# Patient Record
Sex: Female | Born: 1975 | Race: Black or African American | Hispanic: No | Marital: Married | Smoking: Never smoker
Health system: Southern US, Community
[De-identification: ages and names within clinical notes are randomized; demographics above are authoritative.]

## PROBLEM LIST (undated history)

## (undated) DIAGNOSIS — D649 Anemia, unspecified: Secondary | ICD-10-CM

## (undated) DIAGNOSIS — N939 Abnormal uterine and vaginal bleeding, unspecified: Secondary | ICD-10-CM

## (undated) HISTORY — PX: TONSILLECTOMY: SUR1361

---

## 2005-02-12 ENCOUNTER — Other Ambulatory Visit: Payer: Self-pay

## 2005-02-12 ENCOUNTER — Emergency Department: Payer: Self-pay | Admitting: Emergency Medicine

## 2006-06-16 ENCOUNTER — Emergency Department: Payer: Self-pay | Admitting: Emergency Medicine

## 2010-07-02 ENCOUNTER — Emergency Department: Payer: Self-pay | Admitting: Emergency Medicine

## 2011-01-31 ENCOUNTER — Emergency Department: Payer: Self-pay | Admitting: Emergency Medicine

## 2019-10-22 ENCOUNTER — Ambulatory Visit: Admit: 2019-10-22 | Payer: Self-pay | Admitting: Surgery

## 2019-10-22 SURGERY — LAPAROSCOPIC CHOLECYSTECTOMY
Anesthesia: Choice

## 2019-12-06 ENCOUNTER — Other Ambulatory Visit: Payer: Self-pay | Admitting: Family Medicine

## 2019-12-06 DIAGNOSIS — Z20822 Contact with and (suspected) exposure to covid-19: Secondary | ICD-10-CM

## 2019-12-07 ENCOUNTER — Ambulatory Visit: Payer: BC Managed Care – PPO | Attending: Internal Medicine

## 2019-12-07 DIAGNOSIS — Z20822 Contact with and (suspected) exposure to covid-19: Secondary | ICD-10-CM

## 2019-12-08 LAB — NOVEL CORONAVIRUS, NAA: SARS-CoV-2, NAA: NOT DETECTED

## 2020-06-28 DIAGNOSIS — Z1231 Encounter for screening mammogram for malignant neoplasm of breast: Secondary | ICD-10-CM | POA: Diagnosis not present

## 2021-03-28 ENCOUNTER — Other Ambulatory Visit: Payer: Self-pay | Admitting: Family Medicine

## 2021-03-29 LAB — CMP12+LP+TP+TSH+6AC+CBC/D/PLT
ALT: 18 IU/L (ref 0–32)
AST: 23 IU/L (ref 0–40)
Albumin/Globulin Ratio: 1.7 (ref 1.2–2.2)
Albumin: 4.3 g/dL (ref 3.8–4.8)
Alkaline Phosphatase: 66 IU/L (ref 44–121)
BUN/Creatinine Ratio: 13 (ref 9–23)
BUN: 10 mg/dL (ref 6–24)
Basophils Absolute: 0 10*3/uL (ref 0.0–0.2)
Basos: 1 %
Bilirubin Total: 0.5 mg/dL (ref 0.0–1.2)
Calcium: 8.7 mg/dL (ref 8.7–10.2)
Chloride: 103 mmol/L (ref 96–106)
Chol/HDL Ratio: 3 ratio (ref 0.0–4.4)
Cholesterol, Total: 170 mg/dL (ref 100–199)
Creatinine, Ser: 0.77 mg/dL (ref 0.57–1.00)
EOS (ABSOLUTE): 0.2 10*3/uL (ref 0.0–0.4)
Eos: 3 %
Estimated CHD Risk: <0.5 times avg. (ref 0.0–1.0)
Free Thyroxine Index: 2.3 (ref 1.2–4.9)
GGT: 31 IU/L (ref 0–60)
Globulin, Total: 2.5 g/dL (ref 1.5–4.5)
Glucose: 95 mg/dL (ref 65–99)
HDL: 57 mg/dL (ref >39)
Hematocrit: 29.5 % — ABNORMAL LOW (ref 34.0–46.6)
Hemoglobin: 8.4 g/dL — ABNORMAL LOW (ref 11.1–15.9)
Immature Grans (Abs): 0 10*3/uL (ref 0.0–0.1)
Immature Granulocytes: 0 %
Iron: 16 ug/dL — ABNORMAL LOW (ref 27–159)
LDH: 168 IU/L (ref 119–226)
LDL Chol Calc (NIH): 101 mg/dL — ABNORMAL HIGH (ref 0–99)
Lymphocytes Absolute: 1.9 10*3/uL (ref 0.7–3.1)
Lymphs: 35 %
MCH: 19.3 pg — ABNORMAL LOW (ref 26.6–33.0)
MCHC: 28.5 g/dL — ABNORMAL LOW (ref 31.5–35.7)
MCV: 68 fL — ABNORMAL LOW (ref 79–97)
Monocytes Absolute: 0.3 10*3/uL (ref 0.1–0.9)
Monocytes: 6 %
Neutrophils Absolute: 3 10*3/uL (ref 1.4–7.0)
Neutrophils: 55 %
Phosphorus: 2.9 mg/dL — ABNORMAL LOW (ref 3.0–4.3)
Platelets: 304 10*3/uL (ref 150–450)
Potassium: 4.2 mmol/L (ref 3.5–5.2)
RBC: 4.36 x10E6/uL (ref 3.77–5.28)
RDW: 16.3 % — ABNORMAL HIGH (ref 11.7–15.4)
Sodium: 138 mmol/L (ref 134–144)
T3 Uptake Ratio: 25 % (ref 24–39)
T4, Total: 9 ug/dL (ref 4.5–12.0)
TSH: 1.12 u[IU]/mL (ref 0.450–4.500)
Total Protein: 6.8 g/dL (ref 6.0–8.5)
Triglycerides: 64 mg/dL (ref 0–149)
Uric Acid: 4.3 mg/dL (ref 2.6–6.2)
VLDL Cholesterol Cal: 12 mg/dL (ref 5–40)
WBC: 5.5 10*3/uL (ref 3.4–10.8)
eGFR: 96 mL/min/{1.73_m2} (ref >59)

## 2021-04-16 ENCOUNTER — Ambulatory Visit (INDEPENDENT_AMBULATORY_CARE_PROVIDER_SITE_OTHER): Payer: BC Managed Care – PPO | Admitting: Obstetrics and Gynecology

## 2021-04-16 ENCOUNTER — Encounter: Payer: Self-pay | Admitting: Obstetrics and Gynecology

## 2021-04-16 ENCOUNTER — Other Ambulatory Visit: Payer: Self-pay

## 2021-04-16 ENCOUNTER — Other Ambulatory Visit (HOSPITAL_COMMUNITY)
Admission: RE | Admit: 2021-04-16 | Discharge: 2021-04-16 | Disposition: A | Discharge status: Home / Self Care | Payer: BC Managed Care – PPO | Source: Ambulatory Visit | Attending: Obstetrics and Gynecology | Admitting: Obstetrics and Gynecology

## 2021-04-16 VITALS — BP 140/86 | Ht 61.0 in | Wt 192.0 lb

## 2021-04-16 DIAGNOSIS — D500 Iron deficiency anemia secondary to blood loss (chronic): Secondary | ICD-10-CM | POA: Diagnosis not present

## 2021-04-16 DIAGNOSIS — Z124 Encounter for screening for malignant neoplasm of cervix: Secondary | ICD-10-CM | POA: Diagnosis not present

## 2021-04-16 DIAGNOSIS — N939 Abnormal uterine and vaginal bleeding, unspecified: Secondary | ICD-10-CM | POA: Diagnosis not present

## 2021-04-16 NOTE — Patient Instructions (Signed)
Menorrhagia Menorrhagia is a form of abnormal uterine bleeding in which menstrual periods are heavy or last longer than normal. With menorrhagia, the periods may cause enough blood loss and cramping that a woman becomes unable to take part in her usual activities. What are the causes? Common causes of this condition include:  Polyps or fibroids. These are noncancerous growths in the uterus.  An imbalance of the hormones estrogen and progesterone.  Anovulation, which occurs when one of the ovaries does not release an egg during one or more months.  A problem with the thyroid gland (hypothyroidism).  Side effects of having an intrauterine device (IUD).  Side effects of some medicines, such as NSAIDs or blood thinners.  A bleeding disorder that stops the blood from clotting normally. In some cases, the cause of this condition is not known. What increases the risk? You are more likely to develop this condition if you have cancer of the uterus. What are the signs or symptoms? Symptoms of this condition include:  Routinely having to change your pad or tampon every 1-2 hours because it is soaked.  Needing to use pads and tampons at the same time because of heavy bleeding.  Needing to wake up to change your pads or tampons during the night.  Passing blood clots larger than 1 inch (2.5 cm) in size.  Having bleeding that lasts for more than 7 days.  Having symptoms of low iron levels (anemia), such as tiredness (fatigue) or shortness of breath. How is this diagnosed? This condition may be diagnosed based on:  A physical exam.  Your symptoms and menstrual history.  Tests, such as: ? Blood tests to check if you are pregnant or if you have hormonal changes, a bleeding or thyroid disorder, anemia, or other problems. ? Pap test to check for cancerous changes, infections, or inflammation. ? Endometrial biopsy. This test involves removing a tissue sample from the lining of the uterus  (endometrium) to be examined under a microscope. ? Pelvic ultrasound. This test uses sound waves to create images of your uterus, ovaries, and vagina. The images can show if you have fibroids or other growths. ? Hysteroscopy. For this test, a thin, flexible tube with a light on the end (hysteroscope) is used to look inside your uterus. How is this treated? Treatment may not be needed for this condition. If it is needed, the best treatment for you will depend on:  Whether you need to prevent pregnancy.  Your desire to have children in the future.  The cause and severity of your bleeding.  Your personal preference. Medicine Medicines are the first step in treatment. You may be treated with:  Hormonal birth control methods. These treatments reduce bleeding during your menstrual period. They include: ? Birth control pills. ? Skin patch. ? Vaginal ring. ? Shots (injections) that you get every 3 months. ? Hormonal IUD. ? Implants that go under the skin.  Medicines that thicken the blood and slow bleeding.  Medicines that reduce swelling, such as ibuprofen.  Medicines that contain an artificial (synthetic) hormone called progestin.  Medicines that make the ovaries stop working for a short time.  Iron supplements to treat anemia.   Surgery If medicines do not work, surgery may be done. Surgical options may include:  Dilation and curettage (D&C). In this procedure, your health care provider opens the lowest part of the uterus (cervix) and then scrapes or suctions tissue from the endometrium. This reduces menstrual bleeding.  Operative hysteroscopy. In this procedure,   a hysteroscope is used to view your uterus and help remove polyps that may be causing heavy periods.  Endometrial ablation. This is when various techniques are used to permanently destroy your entire endometrium. After endometrial ablation, most women have little or no menstrual flow. This procedure reduces your ability to  become pregnant.  Endometrial resection. In this procedure, an electrosurgical wire loop is used to remove the endometrium. This procedure reduces your ability to become pregnant.  Hysterectomy. This is surgical removal of your uterus. This is a permanent procedure that stops menstrual periods. Pregnancy is not possible after a hysterectomy. Follow these instructions at home: Medicines  Take over-the-counter and prescription medicines only as told by your health care provider. This includes iron pills.  Do not change or switch medicines without asking your health care provider.  Do not take aspirin or medicines that contain aspirin 1 week before or during your menstrual period. Aspirin may make bleeding worse. Managing constipation Your iron pills may cause constipation. If you are taking prescription iron supplements, you may need to take these actions to prevent or treat constipation:  Drink enough fluid to keep your urine pale yellow.  Take over-the-counter or prescription medicines.  Eat foods that are high in fiber, such as beans, whole grains, and fresh fruits and vegetables.  Limit foods that are high in fat and processed sugars, such as fried or sweet foods. General instructions  If you need to change your sanitary pad or tampon more than once every 2 hours, limit your activity until the bleeding stops.  Eat well-balanced meals, including foods that are high in iron. Foods that have a lot of iron include leafy green vegetables, meat, liver, eggs, and whole-grain breads and cereals.  Do not try to lose weight until the abnormal bleeding has stopped and your blood iron level is back to normal. If you need to lose weight, work with your health care provider to lose weight safely.  Keep all follow-up visits. This is important. Contact a health care provider if:  You soak through a pad or tampon every 1 or 2 hours, and this happens every time you have a period.  You need to use  pads and tampons at the same time because you are bleeding so much.  You have nausea, vomiting, diarrhea, or other problems related to medicines you are taking. Get help right away if:  You soak through more than a pad or tampon in 1 hour.  You pass clots bigger than 1 inch (2.5 cm) wide.  You feel short of breath.  You feel like your heart is beating too fast.  You feel dizzy or you faint.  You feel very weak or tired. Summary  Menorrhagia is a form of abnormal uterine bleeding in which menstrual periods are heavy or last longer than normal.  Treatment may not be needed for this condition. If it is needed, it may include medicines or procedures.  Take over-the-counter and prescription medicines only as told by your health care provider. This includes iron pills.  Get help right away if you have heavy bleeding that soaks through more than a pad or tampon in 1 hour, you pass large clots, or you feel dizzy, short of breath, or very weak or tired. This information is not intended to replace advice given to you by your health care provider. Make sure you discuss any questions you have with your health care provider. Document Revised: 08/08/2020 Document Reviewed: 08/08/2020 Elsevier Patient Education  2021   Elsevier Inc[employer]  

## 2021-04-16 NOTE — Progress Notes (Signed)
Gynecology Abnormal Uterine Bleeding Initial Evaluation   Chief Complaint:  Chief Complaint  Patient presents with  . Gynecologic Exam    Self referral - heavy periods. RM 4    History of Present Illness:    Paitient is a 44 y.o. G1P0 who LMP was Patient's last menstrual period was 04/05/2021., presents today for a problem visit.  She complains of heavy menstrual bleeding.  The patient menstrual complaints are chronic present for the past 6 monthsNo currently on any hormonal contraceptive for cycle control.  No recent pap results  Previous evaluation: none Prior Diagnosis: none Previous Treatment: none  Prolactin: No HA, vision changes, nippled discharge Thyroid/PCOS: no change weight, no hirsutism, acne, constipation/diarrhea   Paramter Normal / Abnormal Prsent  Frequency Amenoorhea     Infrequent (>38 days)     Normal (?24 days ?38 days) X   Freequent (<24 days)    Duration Normal (?8 days) X   Prolonged (>8 days)    Regularity Regular (shortest to longest cycle variation ?7-9 days)* X   Irregular (shortest to longest cycle variation ?8-10days)*    Flow Volume Light    (Self reported) Normal     Heavy X      Intermenstrual Bleeding None X   Random     Cyclical early     Cyclical mid     Cyclical late        Unscheduled Bleeding  Not applicable X  (exogenous hormones) Absent     Present     FIGO AUB I System: *The available evidence suggests that, using these criteria, the normal range (shortest to longest) varies with age: 53-25 y of age, ?57 d; 3-41 y, ?7 d; and for 58-45 y, ?9 d    Review of Systems: Review of Systems  Constitutional: Negative.   Gastrointestinal: Negative.   Genitourinary: Negative.   Neurological: Negative for headaches.    Past Medical History:  There are no problems to display for this patient.   Obstetric History: G1P0  Family History:  Family History  Problem Relation Age of Onset  . Hypertension Mother   . Hypertension  Father     Social History:  Social History   Socioeconomic History  . Marital status: Married    Spouse name: Not on file  . Number of children: Not on file  . Years of education: Not on file  . Highest education level: Not on file  Occupational History  . Occupation: Midwife  Tobacco Use  . Smoking status: Never Smoker  . Smokeless tobacco: Never Used  Vaping Use  . Vaping Use: Never used  Substance and Sexual Activity  . Alcohol use: Not Currently  . Drug use: Never  . Sexual activity: Yes    Partners: Male    Birth control/protection: None  Other Topics Concern  . Not on file  Social History Narrative  . Not on file   Social Determinants of Health   Financial Resource Strain: Not on file  Food Insecurity: Not on file  Transportation Needs: Not on file  Physical Activity: Not on file  Stress: Not on file  Social Connections: Not on file  Intimate Partner Violence: Not on file    Allergies:  Allergies  Allergen Reactions  . Guaifenesin Hives    Medications: Prior to Admission medications   Not on File    Physical Exam Blood pressure 140/86, height 5\' 1"  (1.549 m), weight 192 lb (87.1 kg), last menstrual period 04/05/2021.  Patient's last menstrual period was 04/05/2021.  General: NAD HEENT: normocephalic, anicteric Thyroid: no enlargement, no palpable nodules Pulmonary: No increased work of breathing Cardiovascular: RRR, distal pulses 2+ Abdomen: Soft, non-tender, non-distended.  Umbilicus without lesions.  No hepatomegaly, splenomegaly or masses palpable. No evidence of hernia  Genitourinary:  External: Normal external female genitalia.  Normal urethral meatus, normal Bartholin's and Skene's glands.    Vagina: Normal vaginal mucosa, no evidence of prolapse.    Cervix: Grossly normal in appearance, no bleeding  Uterus: Non-enlarged, mobile, normal contour.  No CMT  Adnexa: ovaries non-enlarged, no adnexal masses  Rectal: deferred  Lymphatic: no  evidence of inguinal lymphadenopathy Extremities: no edema, erythema, or tenderness Neurologic: Grossly intact Psychiatric: mood appropriate, affect full  Female chaperone present for pelvic portions of the physical exam  Assessment: 45 y.o. G1P0 with abnormal uterine bleeding  Plan: Problem List Items Addressed This Visit   None   Visit Diagnoses    Abnormal uterine bleeding    -  Primary   Relevant Orders   FSH (Completed)   Estradiol (Completed)   Prolactin (Completed)   US PELVIS TRANSVAGINAL NON-OB (TV ONLY)   Screening for malignant neoplasm of cervix       Relevant Orders   Cytology - PAP (Completed)   Iron deficiency anemia due to chronic blood loss       Relevant Orders   FSH (Completed)   Estradiol (Completed)   Prolactin (Completed)      1) Discussed management options for abnormal uterine bleeding including expectant, NSAIDs, tranexamic acid (Lysteda), oral progesterone (Provera, norethindrone, megace), Depo Provera, Levonorgestrel containing IUD, endometrial ablation (Novasure) or hysterectomy as definitive surgical management.  Discussed risks and benefits of each method.   Final management decision will hinge on results of patient's work up and whether an underlying etiology for the patients bleeding symptoms can be discerned.  We will conduct a basic work up examining using the PALM-COIEN classification system.   The role of unopposed estrogen in the development of endometrial hyperplasia or carcinoma is discussed.  The risk of endometrial hyperplasia is linearly correlated with increasing BMI given the production of estrone by adipose tissue. Printed patient education handouts were given to the patient to review at home.  Bleeding precautions reviewed.  - management options pending results of work up - pap brought up to date  2) Return follow up following results from [facility].   Vena Austria, MD, [facility], [facility] 04/16/2021, 4:02 PM

## 2021-04-17 LAB — ESTRADIOL: Estradiol: 17.4 pg/mL

## 2021-04-17 LAB — PROLACTIN: Prolactin: 22.9 ng/mL (ref 4.8–23.3)

## 2021-04-17 LAB — FOLLICLE STIMULATING HORMONE: FSH: 25.9 m[IU]/mL

## 2021-04-19 LAB — CYTOLOGY - PAP
Comment: NEGATIVE
Diagnosis: NEGATIVE
High risk HPV: NEGATIVE

## 2021-05-25 ENCOUNTER — Ambulatory Visit
Admission: RE | Admit: 2021-05-25 | Discharge: 2021-05-25 | Disposition: A | Discharge status: Home / Self Care | Payer: BC Managed Care – PPO | Source: Ambulatory Visit | Attending: Obstetrics and Gynecology | Admitting: Obstetrics and Gynecology

## 2021-05-25 ENCOUNTER — Other Ambulatory Visit: Payer: Self-pay

## 2021-05-25 DIAGNOSIS — N939 Abnormal uterine and vaginal bleeding, unspecified: Secondary | ICD-10-CM | POA: Diagnosis not present

## 2021-05-28 ENCOUNTER — Ambulatory Visit: Payer: BC Managed Care – PPO | Admitting: Obstetrics and Gynecology

## 2021-05-28 ENCOUNTER — Ambulatory Visit (INDEPENDENT_AMBULATORY_CARE_PROVIDER_SITE_OTHER): Payer: BC Managed Care – PPO

## 2021-05-28 ENCOUNTER — Ambulatory Visit
Admission: EM | Admit: 2021-05-28 | Discharge: 2021-05-28 | Disposition: A | Discharge status: Home / Self Care | Payer: BC Managed Care – PPO | Attending: Internal Medicine | Admitting: Internal Medicine

## 2021-05-28 ENCOUNTER — Encounter: Payer: Self-pay | Admitting: Emergency Medicine

## 2021-05-28 ENCOUNTER — Other Ambulatory Visit: Payer: Self-pay

## 2021-05-28 DIAGNOSIS — M25561 Pain in right knee: Secondary | ICD-10-CM | POA: Diagnosis not present

## 2021-05-28 DIAGNOSIS — W19XXXA Unspecified fall, initial encounter: Secondary | ICD-10-CM

## 2021-05-28 DIAGNOSIS — M79644 Pain in right finger(s): Secondary | ICD-10-CM

## 2021-05-28 DIAGNOSIS — S63601A Unspecified sprain of right thumb, initial encounter: Secondary | ICD-10-CM | POA: Diagnosis not present

## 2021-05-28 MED ORDER — IBUPROFEN 800 MG PO TABS: 800.0000 mg | ORAL_TABLET | Freq: Three times a day (TID) | ORAL | 0 refills | Status: DC

## 2021-05-28 NOTE — Discharge Instructions (Addendum)
Follow up with [facility] in 2 days.  Ice areas of pain for 20 minutes with cloth over the areas 3-4 times a day for the next 2 days
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
## 2021-06-08 DIAGNOSIS — Z87442 Personal history of urinary calculi: Secondary | ICD-10-CM

## 2021-06-08 HISTORY — DX: Personal history of urinary calculi: Z87.442

## 2021-06-14 ENCOUNTER — Emergency Department: Payer: BC Managed Care – PPO

## 2021-06-14 ENCOUNTER — Emergency Department
Admission: EM | Admit: 2021-06-14 | Discharge: 2021-06-14 | Disposition: A | Discharge status: Home / Self Care | Payer: BC Managed Care – PPO | Attending: Student in an Organized Health Care Education/Training Program | Admitting: Student in an Organized Health Care Education/Training Program

## 2021-06-14 ENCOUNTER — Other Ambulatory Visit: Payer: Self-pay

## 2021-06-14 ENCOUNTER — Encounter: Payer: Self-pay | Admitting: Emergency Medicine

## 2021-06-14 DIAGNOSIS — B9689 Other specified bacterial agents as the cause of diseases classified elsewhere: Secondary | ICD-10-CM | POA: Insufficient documentation

## 2021-06-14 DIAGNOSIS — R109 Unspecified abdominal pain: Secondary | ICD-10-CM | POA: Diagnosis not present

## 2021-06-14 DIAGNOSIS — Z20822 Contact with and (suspected) exposure to covid-19: Secondary | ICD-10-CM | POA: Diagnosis not present

## 2021-06-14 DIAGNOSIS — N12 Tubulo-interstitial nephritis, not specified as acute or chronic: Secondary | ICD-10-CM | POA: Insufficient documentation

## 2021-06-14 DIAGNOSIS — R000 Tachycardia, unspecified: Secondary | ICD-10-CM | POA: Diagnosis not present

## 2021-06-14 DIAGNOSIS — R350 Frequency of micturition: Secondary | ICD-10-CM | POA: Diagnosis not present

## 2021-06-14 DIAGNOSIS — N200 Calculus of kidney: Secondary | ICD-10-CM | POA: Diagnosis not present

## 2021-06-14 LAB — BASIC METABOLIC PANEL
Anion gap: 9 (ref 5–15)
BUN: 12 mg/dL (ref 6–20)
CO2: 25 mmol/L (ref 22–32)
Calcium: 8.7 mg/dL — ABNORMAL LOW (ref 8.9–10.3)
Chloride: 106 mmol/L (ref 98–111)
Creatinine, Ser: 0.65 mg/dL (ref 0.44–1.00)
GFR, Estimated: >60 mL/min (ref >60)
Glucose, Bld: 117 mg/dL — ABNORMAL HIGH (ref 70–99)
Potassium: 3.8 mmol/L (ref 3.5–5.1)
Sodium: 140 mmol/L (ref 135–145)

## 2021-06-14 LAB — CBC
HCT: 26.3 % — ABNORMAL LOW (ref 36.0–46.0)
Hemoglobin: 7.5 g/dL — ABNORMAL LOW (ref 12.0–15.0)
MCH: 19.7 pg — ABNORMAL LOW (ref 26.0–34.0)
MCHC: 28.5 g/dL — ABNORMAL LOW (ref 30.0–36.0)
MCV: 69 fL — ABNORMAL LOW (ref 80.0–100.0)
Platelets: 300 10*3/uL (ref 150–400)
RBC: 3.81 MIL/uL — ABNORMAL LOW (ref 3.87–5.11)
RDW: 18.4 % — ABNORMAL HIGH (ref 11.5–15.5)
WBC: 9.9 10*3/uL (ref 4.0–10.5)
nRBC: 0 % (ref 0.0–0.2)

## 2021-06-14 LAB — URINALYSIS, COMPLETE (UACMP) WITH MICROSCOPIC
Bilirubin Urine: NEGATIVE
Glucose, UA: NEGATIVE mg/dL
Ketones, ur: NEGATIVE mg/dL
Nitrite: NEGATIVE
Protein, ur: NEGATIVE mg/dL
RBC / HPF: >50 RBC/hpf — ABNORMAL HIGH (ref 0–5)
Specific Gravity, Urine: 1.01 (ref 1.005–1.030)
pH: 6 (ref 5.0–8.0)

## 2021-06-14 LAB — POC URINE PREG, ED: Preg Test, Ur: NEGATIVE

## 2021-06-14 LAB — RESP PANEL BY RT-PCR (FLU A&B, COVID) ARPGX2
Influenza A by PCR: NEGATIVE
Influenza B by PCR: NEGATIVE
SARS Coronavirus 2 by RT PCR: NEGATIVE

## 2021-06-14 LAB — CK: Total CK: 70 U/L (ref 38–234)

## 2021-06-14 LAB — LACTIC ACID, PLASMA: Lactic Acid, Venous: 0.7 mmol/L (ref 0.5–1.9)

## 2021-06-14 MED ORDER — OXYCODONE HCL 5 MG PO TABS
5.0000 mg | ORAL_TABLET | Freq: Once | ORAL | Status: AC
  Administered 2021-06-14: 5 mg via ORAL
  Filled 2021-06-14: qty 1

## 2021-06-14 MED ORDER — ONDANSETRON 4 MG PO TBDP: 4.0000 mg | ORAL_TABLET | Freq: Three times a day (TID) | ORAL | 0 refills | Status: DC | PRN

## 2021-06-14 MED ORDER — CEPHALEXIN 500 MG PO CAPS: 500.0000 mg | ORAL_CAPSULE | Freq: Three times a day (TID) | ORAL | 0 refills | Status: AC

## 2021-06-14 MED ORDER — ACETAMINOPHEN 500 MG PO TABS
1000.0000 mg | ORAL_TABLET | Freq: Once | ORAL | Status: AC
  Administered 2021-06-14: 1000 mg via ORAL
  Filled 2021-06-14: qty 2

## 2021-06-14 MED ORDER — SODIUM CHLORIDE 0.9 % IV SOLN
1.0000 g | Freq: Once | INTRAVENOUS | Status: AC
  Administered 2021-06-14: 1 g via INTRAVENOUS
  Filled 2021-06-14: qty 10

## 2021-06-14 MED ORDER — HYDROCODONE-ACETAMINOPHEN 5-325 MG PO TABS: 1.0000 | ORAL_TABLET | ORAL | 0 refills | Status: DC | PRN

## 2021-06-14 MED ORDER — SODIUM CHLORIDE 0.9 % IV BOLUS
1000.0000 mL | Freq: Once | INTRAVENOUS | Status: AC
  Administered 2021-06-14: 1000 mL via INTRAVENOUS

## 2021-06-14 NOTE — ED Triage Notes (Signed)
Pt comes into the ED via POV c/o weakness and possible dehydration.  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  Pt currently has even and unlabored respirations and is in NAD.

## 2021-06-14 NOTE — ED Provider Notes (Signed)
[facility] Emergency Department Provider Note    Event Date/Time   First MD Initiated Contact with Patient 06/14/21 1338     (approximate)  I have reviewed the triage vital signs and the nursing notes.   HISTORY  Chief Complaint Dehydration and Weakness    HPI Kara Carter is a 45 y.o. female presents to the ER for feeling dehydrated and overheated.  States she works in a [employer] where they do not have [employer].  Feels like her urine output has decreased but does also feel like she is having back pain and having increased urinary frequency.  Came to the ER today because she felt severely fatigued with worsening symptoms while at work today.  History reviewed. No pertinent past medical history. Family History  Problem Relation Age of Onset  . Hypertension Mother   . Hypertension Father    History reviewed. No pertinent surgical history. There are no problems to display for this patient.     Prior to Admission medications   Medication Sig Start Date End Date Taking? Authorizing Provider  cephALEXin (KEFLEX) 500 MG capsule Take 1 capsule (500 mg total) by mouth 3 (three) times daily for 7 days. 06/14/21 06/21/21 Yes Willy Eddy, MD  ondansetron (ZOFRAN ODT) 4 MG disintegrating tablet Take 1 tablet (4 mg total) by mouth every 8 (eight) hours as needed for nausea or vomiting. 06/14/21  Yes Willy Eddy, MD  ibuprofen (ADVIL) 800 MG tablet Take 1 tablet (800 mg total) by mouth 3 (three) times daily. 05/28/21   Rodriguez-Southworth, Nettie Elm, PA-C    Allergies Guaifenesin    Social History Social History   Tobacco Use  . Smoking status: Never  . Smokeless tobacco: Never  Vaping Use  . Vaping Use: Never used  Substance Use Topics  . Alcohol use: Not Currently  . Drug use: Never    Review of Systems Patient denies headaches, rhinorrhea, blurry vision, numbness, shortness of breath, chest pain, edema, cough, abdominal pain, nausea, vomiting,  diarrhea, dysuria, fevers, rashes or hallucinations unless otherwise stated above in HPI. ____________________________________________   PHYSICAL EXAM:  VITAL SIGNS: Vitals:   06/14/21 1340 06/14/21 1512  BP: (!) 111/94 (!) 150/87  Pulse: 95 91  Resp: 18 16  Temp:    SpO2: 100% 100%    Constitutional: Alert and oriented.  Eyes: Conjunctivae are normal.  Head: Atraumatic. Nose: No congestion/rhinnorhea. Mouth/Throat: Mucous membranes are moist.   Neck: No stridor. Painless ROM.  Cardiovascular: Normal rate, regular rhythm. Grossly normal heart sounds.  Good peripheral circulation. Respiratory: Normal respiratory effort.  No retractions. Lungs CTAB. Gastrointestinal: Soft and nontender. No distention. No abdominal bruits. + BL CVA tenderness. Genitourinary:  Musculoskeletal: No lower extremity tenderness nor edema.  No joint effusions. Neurologic:  Normal speech and language. No gross focal neurologic deficits are appreciated. No facial droop Skin:  Skin is warm, dry and intact. No rash noted. Psychiatric: Mood and affect are normal. Speech and behavior are normal.  ____________________________________________   LABS (all labs ordered are listed, but only abnormal results are displayed)  Results for orders placed or performed during the hospital encounter of 06/14/21 (from the past 24 hour(s))  Basic metabolic panel     Status: Abnormal   Collection Time: 06/14/21 12:43 PM  Result Value Ref Range   Sodium 140 135 - 145 mmol/L   Potassium 3.8 3.5 - 5.1 mmol/L   Chloride 106 98 - 111 mmol/L   CO2 25 22 - 32 mmol/L  Glucose, Bld 117 (H) 70 - 99 mg/dL   BUN 12 6 - 20 mg/dL   Creatinine, Ser 1.94 0.44 - 1.00 mg/dL   Calcium 8.7 (L) 8.9 - 10.3 mg/dL   GFR, Estimated >17 >40 mL/min   Anion gap 9 5 - 15  CBC     Status: Abnormal   Collection Time: 06/14/21 12:43 PM  Result Value Ref Range   WBC 9.9 4.0 - 10.5 K/uL   RBC 3.81 (L) 3.87 - 5.11 MIL/uL   Hemoglobin 7.5 (L)  12.0 - 15.0 g/dL   HCT 81.4 (L) 48.1 - 85.6 %   MCV 69.0 (L) 80.0 - 100.0 fL   MCH 19.7 (L) 26.0 - 34.0 pg   MCHC 28.5 (L) 30.0 - 36.0 g/dL   RDW 31.4 (H) 97.0 - 26.3 %   Platelets 300 150 - 400 K/uL   nRBC 0.0 0.0 - 0.2 %  Urinalysis, Complete w Microscopic Urine, Clean Catch     Status: Abnormal   Collection Time: 06/14/21 12:45 PM  Result Value Ref Range   Color, Urine YELLOW (A) YELLOW   APPearance HAZY (A) CLEAR   Specific Gravity, Urine 1.010 1.005 - 1.030   pH 6.0 5.0 - 8.0   Glucose, UA NEGATIVE NEGATIVE mg/dL   Hgb urine dipstick LARGE (A) NEGATIVE   Bilirubin Urine NEGATIVE NEGATIVE   Ketones, ur NEGATIVE NEGATIVE mg/dL   Protein, ur NEGATIVE NEGATIVE mg/dL   Nitrite NEGATIVE NEGATIVE   Leukocytes,Ua SMALL (A) NEGATIVE   RBC / HPF >50 (H) 0 - 5 RBC/hpf   WBC, UA 21-50 0 - 5 WBC/hpf   Bacteria, UA MANY (A) NONE SEEN   Squamous Epithelial / LPF 6-10 0 - 5   Mucus PRESENT   POC urine preg, ED     Status: None   Collection Time: 06/14/21 12:50 PM  Result Value Ref Range   Preg Test, Ur NEGATIVE NEGATIVE  Resp Panel by RT-PCR (Flu A&B, Covid) Nasopharyngeal Swab     Status: None   Collection Time: 06/14/21  2:02 PM   Specimen: Nasopharyngeal Swab; Nasopharyngeal(NP) swabs in vial transport medium  Result Value Ref Range   SARS Coronavirus 2 by RT PCR NEGATIVE NEGATIVE   Influenza A by PCR NEGATIVE NEGATIVE   Influenza B by PCR NEGATIVE NEGATIVE  CK     Status: None   Collection Time: 06/14/21  2:02 PM  Result Value Ref Range   Total CK 70 38 - 234 U/L  Lactic acid, plasma     Status: None   Collection Time: 06/14/21  2:22 PM  Result Value Ref Range   Lactic Acid, Venous 0.7 0.5 - 1.9 mmol/L   ____________________________________________  EKG My review and personal interpretation at Time: 12:42   Indication: weakness  Rate: 100  Rhythm: sinus Axis: normal Other: normal intervals, no stemi ____________________________________________  RADIOLOGY  I  personally reviewed all radiographic images ordered to evaluate for the above acute complaints and reviewed radiology reports and findings.  These findings were personally discussed with the patient.  Please see medical record for radiology report. ____________________________________________   PROCEDURES  Procedure(s) performed:  Procedures    Critical Care performed: o ____________________________________________   INITIAL IMPRESSION / ASSESSMENT AND PLAN / ED COURSE  Pertinent labs & imaging results that were available during my care of the patient were reviewed by me and considered in my medical decision making (see chart for details).   DDX: Heat illness, dehydration, electrolyte abnormality, pyelonephritis, enteritis,  diverticulitis, stone  Lan K Peeters is a 45 y.o. who presents to the ED with presentation as described above.  Patient nontoxic-appearing but is febrile mildly tachycardic but well perfused.  Will give IV hydration.  Will check blood work.  Urinalysis does appear infected raising suspicion for pyelonephritis.  Does have many red cells she is not on her menstrual cycle.  Given flank pain will order CT to evaluate for obstructing stone.  Will give IV antibiotics  Clinical Course as of 06/14/21 1610  Thu Jun 14, 2021  1455 Lactate is normal no white count.  Not consistent with sepsis. [PR]  1549 Patient be signed out to oncoming physician pending CT imaging results. [PR]  1609 Patient reassessed.  Feels well.  CT imaging reassuring.  Does appear appropriate for trial of outpatient management.  Discussed strict return precautions. [PR]    Clinical Course User Index [PR] Willy Eddy, MD    The patient was evaluated in Emergency Department today for the symptoms described in the history of present illness. He/she was evaluated in the context of the global COVID-19 pandemic, which necessitated consideration that the patient might be at risk for infection with the  SARS-CoV-2 virus that causes COVID-19. Institutional protocols and algorithms that pertain to the evaluation of patients at risk for COVID-19 are in a state of rapid change based on information released by regulatory bodies including the CDC and federal and state organizations. These policies and algorithms were followed during the patient's care in the ED.  As part of my medical decision making, I reviewed the following data within the electronic MEDICAL RECORD NUMBER Nursing notes reviewed and incorporated, Labs reviewed, notes from prior ED visits and [place] Controlled Substance Database   ____________________________________________   FINAL CLINICAL IMPRESSION(S) / ED DIAGNOSES  Final diagnoses:  Pyelonephritis      NEW MEDICATIONS STARTED DURING THIS VISIT:  New Prescriptions   CEPHALEXIN (KEFLEX) 500 MG CAPSULE    Take 1 capsule (500 mg total) by mouth 3 (three) times daily for 7 days.   ONDANSETRON (ZOFRAN ODT) 4 MG DISINTEGRATING TABLET    Take 1 tablet (4 mg total) by mouth every 8 (eight) hours as needed for nausea or vomiting.     Note:  This document was prepared using Dragon voice recognition software and may include unintentional dictation errors.    Willy Eddy, MD 06/14/21 [phone]

## 2021-06-27 ENCOUNTER — Other Ambulatory Visit: Payer: Self-pay

## 2021-06-27 ENCOUNTER — Ambulatory Visit (INDEPENDENT_AMBULATORY_CARE_PROVIDER_SITE_OTHER): Payer: BLUE CROSS/BLUE SHIELD | Admitting: Obstetrics and Gynecology

## 2021-06-27 VITALS — BP 128/72 | HR 74 | Ht 61.0 in | Wt 186.0 lb

## 2021-06-27 DIAGNOSIS — D500 Iron deficiency anemia secondary to blood loss (chronic): Secondary | ICD-10-CM

## 2021-06-27 DIAGNOSIS — N939 Abnormal uterine and vaginal bleeding, unspecified: Secondary | ICD-10-CM

## 2021-06-27 NOTE — H&P (View-Only) (Signed)
Gynecology Ultrasound Follow Up  Chief Complaint:  Chief Complaint  Patient presents with   Follow-up    U/S for AUB 05/25/21 [facility]     History of Present Illness: Patient is a 45 y.o. female who presents today for ultrasound evaluation of AUB.  Ultrasound demonstrates the following findgins Adnexa: normal ovaries bilaterally Uterus: Non-enlarged, homogenous myometrium no evidence of uterine fibroids with endometrial stripe  non-thickened 40mm Additional: no free fluid  Review of Systems: Review of Systems  Constitutional: Negative.   Gastrointestinal: Negative.   Genitourinary: Negative.    Past Medical History:  No past medical history on file.  Past Surgical History:  No past surgical history on file.  Gynecologic History:  Patient's last menstrual period was 05/23/2021 (exact date).   Family History:  Family History  Problem Relation Age of Onset   Hypertension Mother    Hypertension Father     Social History:  Social History   Socioeconomic History   Marital status: Married    Spouse name: Not on file   Number of children: Not on file   Years of education: Not on file   Highest education level: Not on file  Occupational History   Occupation: Midwife  Tobacco Use   Smoking status: Never   Smokeless tobacco: Never  Vaping Use   Vaping Use: Never used  Substance and Sexual Activity   Alcohol use: Not Currently   Drug use: Never   Sexual activity: Yes    Partners: Male    Birth control/protection: None  Other Topics Concern   Not on file  Social History Narrative   Not on file   Social Determinants of Health   Financial Resource Strain: Not on file  Food Insecurity: Not on file  Transportation Needs: Not on file  Physical Activity: Not on file  Stress: Not on file  Social Connections: Not on file  Intimate Partner Violence: Not on file    Allergies:  Allergies  Allergen Reactions   Guaifenesin Hives    Medications: Prior to  Admission medications   Not on File    Physical Exam Vitals: Blood pressure 128/72, pulse 74, height 5\' 1"  (1.549 m), weight 186 lb (84.4 kg), last menstrual period 05/23/2021.  General: NAD HEENT: normocephalic, anicteric Pulmonary: No increased work of breathing Extremities: no edema, erythema, or tenderness Neurologic: Grossly intact, normal gait Psychiatric: mood appropriate, affect full  CT Renal Stone Study  Result Date: 06/14/2021 CLINICAL DATA:  Flank pain EXAM: CT ABDOMEN AND PELVIS WITHOUT CONTRAST TECHNIQUE: Multidetector CT imaging of the abdomen and pelvis was performed following the standard protocol without IV contrast. COMPARISON:  None. FINDINGS: Lower chest: Lung bases demonstrate atelectasis or scarring at the left base. Borderline cardiomegaly. Hepatobiliary: No focal liver abnormality is seen. No gallstones, gallbladder wall thickening, or biliary dilatation. Pancreas: Unremarkable. No pancreatic ductal dilatation or surrounding inflammatory changes. Spleen: Normal in size without focal abnormality. Adrenals/Urinary Tract: Adrenal glands are normal. There are small intrarenal stones bilaterally. No hydronephrosis or ureteral stone. Right greater than left perinephric fat stranding. The urinary bladder is normal Stomach/Bowel: Stomach is within normal limits. Appendix appears normal. No evidence of bowel wall thickening, distention, or inflammatory changes. Vascular/Lymphatic: No significant vascular findings are present. No enlarged abdominal or pelvic lymph nodes. Reproductive: Uterus and bilateral adnexa are unremarkable. Other: No free air.  Small free fluid in the pelvis Musculoskeletal: No acute or significant osseous findings. IMPRESSION: 1. Small intrarenal stones without hydronephrosis or ureteral stone. 2.  Right greater than left perinephric fat stranding which is nonspecific though consider pyelonephritis in the appropriate clinical setting. 3. Small free fluid in the  pelvis Electronically Signed   By: Jasmine PangKim  Fujinaga M.D.   On: 06/14/2021 15:55    Recent Results (from the past 2160 hour(s))  Cytology - PAP     Status: None   Collection Time: 04/16/21  4:11 PM  Result Value Ref Range   High risk HPV Negative    Adequacy      Satisfactory for evaluation; transformation zone component PRESENT.   Diagnosis      - Negative for intraepithelial lesion or malignancy (NILM)   Comment Normal Reference Range HPV - Negative   FSH     Status: None   Collection Time: 04/16/21  4:13 PM  Result Value Ref Range   FSH 25.9 mIU/mL    Comment:                     Adult Female:                       Follicular phase      3.5 -  12.5                       Ovulation phase       4.7 -  21.5                       Luteal phase          1.7 -   7.7                       Postmenopausal       25.8 - 134.8   Estradiol     Status: None   Collection Time: 04/16/21  4:13 PM  Result Value Ref Range   Estradiol 17.4 pg/mL    Comment:                     Adult Female:                       Follicular phase   12.5 -   166.0                       Ovulation phase    85.8 -   498.0                       Luteal phase       43.8 -   211.0                       Postmenopausal     <6.0 -    54.7                     Pregnancy                       1st trimester     215.0 - >4300.0 Roche ECLIA methodology   Prolactin     Status: None   Collection Time: 04/16/21  4:13 PM  Result Value Ref Range   Prolactin 22.9 4.8 - 23.3 ng/mL  Basic metabolic panel     Status: Abnormal   Collection Time: 06/14/21 12:43 PM  Result Value Ref  Range   Sodium 140 135 - 145 mmol/L   Potassium 3.8 3.5 - 5.1 mmol/L   Chloride 106 98 - 111 mmol/L   CO2 25 22 - 32 mmol/L   Glucose, Bld 117 (H) 70 - 99 mg/dL    Comment: Glucose reference range applies only to samples taken after fasting for at least 8 hours.   BUN 12 6 - 20 mg/dL   Creatinine, Ser 4.58 0.44 - 1.00 mg/dL   Calcium 8.7 (L) 8.9 - 10.3  mg/dL   GFR, Estimated >09 >98 mL/min    Comment: (NOTE) Calculated using the CKD-EPI Creatinine Equation (2021)    Anion gap 9 5 - 15    Comment: Performed at [facility], [address] [place], [place] [address]  CBC     Status: Abnormal   Collection Time: 06/14/21 12:43 PM  Result Value Ref Range   WBC 9.9 4.0 - 10.5 K/uL   RBC 3.81 (L) 3.87 - 5.11 MIL/uL   Hemoglobin 7.5 (L) 12.0 - 15.0 g/dL    Comment: Reticulocyte Hemoglobin testing may be clinically indicated, consider ordering this additional test [id number]    HCT 26.3 (L) 36.0 - 46.0 %   MCV 69.0 (L) 80.0 - 100.0 fL   MCH 19.7 (L) 26.0 - 34.0 pg   MCHC 28.5 (L) 30.0 - 36.0 g/dL   RDW 41.9 (H) 37.9 - 02.4 %   Platelets 300 150 - 400 K/uL   nRBC 0.0 0.0 - 0.2 %    Comment: Performed at [facility], [address] [place], [place] [address]  Urinalysis, Complete w Microscopic Urine, Clean Catch     Status: Abnormal   Collection Time: 06/14/21 12:45 PM  Result Value Ref Range   Color, Urine YELLOW (A) YELLOW   APPearance HAZY (A) CLEAR   Specific Gravity, Urine 1.010 1.005 - 1.030   pH 6.0 5.0 - 8.0   Glucose, UA NEGATIVE NEGATIVE mg/dL   Hgb urine dipstick LARGE (A) NEGATIVE   Bilirubin Urine NEGATIVE NEGATIVE   Ketones, ur NEGATIVE NEGATIVE mg/dL   Protein, ur NEGATIVE NEGATIVE mg/dL   Nitrite NEGATIVE NEGATIVE   Leukocytes,Ua SMALL (A) NEGATIVE   RBC / HPF >50 (H) 0 - 5 RBC/hpf   WBC, UA 21-50 0 - 5 WBC/hpf   Bacteria, UA MANY (A) NONE SEEN   Squamous Epithelial / LPF 6-10 0 - 5   Mucus PRESENT     Comment: Performed at [facility], [address] [place], [place] [address]  POC urine preg, ED     Status: None   Collection Time: 06/14/21 12:50 PM  Result Value Ref Range   Preg Test, Ur NEGATIVE NEGATIVE    Comment:        THE SENSITIVITY OF THIS METHODOLOGY IS >24 mIU/mL   Resp Panel by RT-PCR (Flu A&B, Covid) Nasopharyngeal Swab     Status: None   Collection Time:  06/14/21  2:02 PM   Specimen: Nasopharyngeal Swab; Nasopharyngeal(NP) swabs in vial transport medium  Result Value Ref Range   SARS Coronavirus 2 by RT PCR NEGATIVE NEGATIVE    Comment: (NOTE) SARS-CoV-2 target nucleic acids are NOT DETECTED.  The SARS-CoV-2 RNA is generally detectable in upper respiratory specimens during the acute phase of infection. The lowest concentration of SARS-CoV-2 viral copies this assay can detect is 138 copies/mL. A negative result does not preclude SARS-Cov-2 infection and should not be used as the sole basis for treatment or other patient  management decisions. A negative result may occur with  improper specimen collection/handling, submission of specimen other than nasopharyngeal swab, presence of viral mutation(s) within the areas targeted by this assay, and inadequate number of viral copies(<138 copies/mL). A negative result must be combined with clinical observations, patient history, and epidemiological information. The expected result is Negative.  Fact Sheet for Patients:  BloggerCourse.com  Fact Sheet for Healthcare Providers:  SeriousBroker.it  This test is no t yet approved or cleared by the [place] FDA and  has been authorized for detection and/or diagnosis of SARS-CoV-2 by FDA under an Emergency Use Authorization (EUA). This EUA will remain  in effect (meaning this test can be used) for the duration of the COVID-19 declaration under Section 564(b)(1) of the Act, 21 U.S.C.section 360bbb-3(b)(1), unless the authorization is terminated  or revoked sooner.       Influenza A by PCR NEGATIVE NEGATIVE   Influenza B by PCR NEGATIVE NEGATIVE    Comment: (NOTE) The Xpert Xpress SARS-CoV-2/FLU/RSV plus assay is intended as an aid in the diagnosis of influenza from Nasopharyngeal swab specimens and should not be used as a sole basis for treatment. Nasal washings and aspirates are  unacceptable for Xpert Xpress SARS-CoV-2/FLU/RSV testing.  Fact Sheet for Patients: BloggerCourse.com  Fact Sheet for Healthcare Providers: SeriousBroker.it  This test is not yet approved or cleared by the [place] FDA and has been authorized for detection and/or diagnosis of SARS-CoV-2 by FDA under an Emergency Use Authorization (EUA). This EUA will remain in effect (meaning this test can be used) for the duration of the COVID-19 declaration under Section 564(b)(1) of the Act, 21 U.S.C. section 360bbb-3(b)(1), unless the authorization is terminated or revoked.  Performed at [facility], [address] [place], [place] [address]   CK     Status: None   Collection Time: 06/14/21  2:02 PM  Result Value Ref Range   Total CK 70 38 - 234 U/L    Comment: Performed at [facility], [address] [place], [place] [address]  Lactic acid, plasma     Status: None   Collection Time: 06/14/21  2:22 PM  Result Value Ref Range   Lactic Acid, Venous 0.7 0.5 - 1.9 mmol/L    Comment: Performed at [facility], [address], [place], [place] [address]    Assessment: 45 y.o. G1P0 AUB follow up   Plan: Problem List Items Addressed This Visit   None Visit Diagnoses     Abnormal uterine bleeding (AUB)    -  Primary   Chronic blood loss anemia           1)  AUB - PALM-COIEN system work up complete and normal.  Laboratory evaluation showing some mild elevation in [facility] so most likely etiology is perimenopausal bleeding..  Management option discussed including expectant, hormonal (oral, depo provera, IUD), endometrial ablation, and hysterectomy - patient interested in ablation.  The patient was counseled on the overall effectiveness of endometrial ablation in achieving amenorrhea.  She is aware that some patient may continue to have menstrual cycles although these are generally greatly reduced in flow.  In  addition she was quoted a failure rate for endometrial ablation of approximately 25% within the frist 4 years, but these failures may happen at any time during or after the initial 4 year postop period.  She is aware that pregnancy is contra-indicated in the setting of prior endometrial ablation, and that ablation itself does not confer any contraceptive  benefit.  She will therefore need to continue to rely on some means of contraception following the procedure.  Although rare and generally confined to patient who have undergone prior tubal ligatoin, post-ablation tubal sterilizaton syndrome (PATSS) may also occur with no reliable incidence rates as the majority of published literature is limited to case reports.   Prior to being considered a candidate for Novasure ablation she will need to undergo endometrial biopsy to rule out endometrial hyperplasia or malignancy as the cause of her bleeding, have an up to date pap on record, and undergo transvaginal ultrasound to verify the absence of focal endometrial lesion which may need to be addressed prior to proceeding with ablation.  In addition she is aware that the device is limited for use in women with a normal uterine cavity and uterine leiomyomata <3cm in size.  If present leiomyomata may increase the long-term failure rate of the procedure.  In rare instances the presence of a uterine septum or arcuate uterus, which may not be readily apparent on preoperative ultrasound, may necessitate the procedure to be aborted.    2) A total of 15 minutes were spent in face-to-face contact with the patient during this encounter with over half of that time devoted to counseling and coordination of care.  3)Return well be called with surgery date.    Vena Austria, MD, [facility], [facility] 06/27/2021, 8:51 PM

## 2021-06-27 NOTE — Patient Instructions (Signed)
Endometrial Ablation Endometrial ablation is a procedure that destroys the thin inner layer of the lining of the uterus (endometrium). This procedure may be done: To stop heavy menstrual periods. To stop bleeding that is causing anemia. To control irregular bleeding. To treat bleeding caused by small tumors (fibroids) in the endometrium. This procedure is often done as an alternative to major surgery, such as removal of the uterus and cervix (hysterectomy). As a result of this procedure: You may not be able to have children. However, if you have not yet gone through menopause: You may still have a small chance of getting pregnant. You will need to use a reliable method of birth control after the procedure to prevent pregnancy. You may stop having a menstrual period, or you may have only a small amount of bleeding during your period. Menstruation may return several years after the procedure. Tell a health care provider about: Any allergies you have. All medicines you are taking, including vitamins, herbs, eye drops, creams, and over-the-counter medicines. Any problems you or family members have had with the use of anesthetic medicines. Any blood disorders you have. Any surgeries you have had. Any medical conditions you have. Whether you are pregnant or may be pregnant. What are the risks? Generally, this is a safe procedure. However, problems may occur, including: A hole (perforation) in the uterus or bowel. Infection in the uterus, bladder, or vagina. Bleeding. Allergic reaction to medicines. Damage to nearby structures or organs. An air bubble in the lung (air embolus). Problems with pregnancy. Failure of the procedure. Decreased ability to diagnose cancer in the endometrium. Scar tissue forms after the procedure, making it more difficult to get a sample of the uterine lining. What happens before the procedure? Medicines Ask your health care provider about: Changing or stopping your  regular medicines. This is especially important if you take diabetes medicines or blood thinners. Taking medicines such as aspirin and ibuprofen. These medicines can thin your blood. Do not take these medicines before your procedure if your doctor tells you not to take them. Taking over-the-counter medicines, vitamins, herbs, and supplements. Tests You will have tests of your endometrium to make sure there are no precancerous cells or cancer cells present. You may have an ultrasound of the uterus. General instructions Do not use any products that contain nicotine or tobacco for at least 4 weeks before the procedure. These include cigarettes, chewing tobacco, and vaping devices, such as e-cigarettes. If you need help quitting, ask your health care provider. You may be given medicines to thin the endometrium. Ask your health care provider what steps will be taken to help prevent infection. These steps may include: Removing hair at the surgery site. Washing skin with a germ-killing soap. Taking antibiotic medicine. Plan to have a responsible adult take you home from the hospital or clinic. Plan to have a responsible adult care for you for the time you are told after you leave the hospital or clinic. This is important. What happens during the procedure?  You will lie on an exam table with your feet and legs supported as in a pelvic exam. An IV will be inserted into one of your veins. You will be given a medicine to help you relax (sedative). A surgical tool with a light and camera (resectoscope) will be inserted into your vagina and moved into your uterus. This allows your surgeon to see inside your uterus. Endometrial tissue will be destroyed and removed, using one of the following methods: Radiofrequency. This   uses an electrical current to destroy the endometrium. Cryotherapy. This uses extreme cold to freeze the endometrium. Heated fluid. This uses a heated salt and water (saline) solution to  destroy the endometrium. Microwave. This uses high-energy microwaves to heat up the endometrium and destroy it. Thermal balloon. This involves inserting a catheter with a balloon tip into the uterus. The balloon tip is filled with heated fluid to destroy the endometrium. The procedure may vary among health care providers and hospitals. What happens after the procedure? Your blood pressure, heart rate, breathing rate, and blood oxygen level will be monitored until you leave the hospital or clinic. You may have vaginal bleeding for 4-6 weeks after the procedure. You may also have: Cramps. A thin, watery vaginal discharge that is light pink or brown. A need to urinate more than usual. Nausea. If you were given a sedative during the procedure, it can affect you for several hours. Do not drive or operate machinery until your health care provider says that it is safe. Do not have sex or insert anything into your vagina until your health care provider says it is safe. Summary Endometrial ablation is done to treat many causes of heavy menstrual bleeding. The procedure destroys the thin inner layer of the lining of the uterus (endometrium). This procedure is often done as an alternative to major surgery, such as removal of the uterus and cervix (hysterectomy). Plan to have a responsible adult take you home from the hospital or clinic. This information is not intended to replace advice given to you by your health care provider. Make sure you discuss any questions you have with your health care provider. Document Revised: 06/15/2020 Document Reviewed: 06/15/2020 Elsevier Patient Education  2022 [employer] Inc.  

## 2021-06-27 NOTE — Progress Notes (Signed)
Gynecology Ultrasound Follow Up  Chief Complaint:  Chief Complaint  Patient presents with   Follow-up    U/S for AUB 05/25/21 [facility]     History of Present Illness: Patient is a 45 y.o. female who presents today for ultrasound evaluation of AUB.  Ultrasound demonstrates the following findgins Adnexa: normal ovaries bilaterally Uterus: Non-enlarged, homogenous myometrium no evidence of uterine fibroids with endometrial stripe  non-thickened 40mm Additional: no free fluid  Review of Systems: Review of Systems  Constitutional: Negative.   Gastrointestinal: Negative.   Genitourinary: Negative.    Past Medical History:  No past medical history on file.  Past Surgical History:  No past surgical history on file.  Gynecologic History:  Patient's last menstrual period was 05/23/2021 (exact date).   Family History:  Family History  Problem Relation Age of Onset   Hypertension Mother    Hypertension Father     Social History:  Social History   Socioeconomic History   Marital status: Married    Spouse name: Not on file   Number of children: Not on file   Years of education: Not on file   Highest education level: Not on file  Occupational History   Occupation: Midwife  Tobacco Use   Smoking status: Never   Smokeless tobacco: Never  Vaping Use   Vaping Use: Never used  Substance and Sexual Activity   Alcohol use: Not Currently   Drug use: Never   Sexual activity: Yes    Partners: Male    Birth control/protection: None  Other Topics Concern   Not on file  Social History Narrative   Not on file   Social Determinants of Health   Financial Resource Strain: Not on file  Food Insecurity: Not on file  Transportation Needs: Not on file  Physical Activity: Not on file  Stress: Not on file  Social Connections: Not on file  Intimate Partner Violence: Not on file    Allergies:  Allergies  Allergen Reactions   Guaifenesin Hives    Medications: Prior to  Admission medications   Not on File    Physical Exam Vitals: Blood pressure 128/72, pulse 74, height 5\' 1"  (1.549 m), weight 186 lb (84.4 kg), last menstrual period 05/23/2021.  General: NAD HEENT: normocephalic, anicteric Pulmonary: No increased work of breathing Extremities: no edema, erythema, or tenderness Neurologic: Grossly intact, normal gait Psychiatric: mood appropriate, affect full  CT Renal Stone Study  Result Date: 06/14/2021 CLINICAL DATA:  Flank pain EXAM: CT ABDOMEN AND PELVIS WITHOUT CONTRAST TECHNIQUE: Multidetector CT imaging of the abdomen and pelvis was performed following the standard protocol without IV contrast. COMPARISON:  None. FINDINGS: Lower chest: Lung bases demonstrate atelectasis or scarring at the left base. Borderline cardiomegaly. Hepatobiliary: No focal liver abnormality is seen. No gallstones, gallbladder wall thickening, or biliary dilatation. Pancreas: Unremarkable. No pancreatic ductal dilatation or surrounding inflammatory changes. Spleen: Normal in size without focal abnormality. Adrenals/Urinary Tract: Adrenal glands are normal. There are small intrarenal stones bilaterally. No hydronephrosis or ureteral stone. Right greater than left perinephric fat stranding. The urinary bladder is normal Stomach/Bowel: Stomach is within normal limits. Appendix appears normal. No evidence of bowel wall thickening, distention, or inflammatory changes. Vascular/Lymphatic: No significant vascular findings are present. No enlarged abdominal or pelvic lymph nodes. Reproductive: Uterus and bilateral adnexa are unremarkable. Other: No free air.  Small free fluid in the pelvis Musculoskeletal: No acute or significant osseous findings. IMPRESSION: 1. Small intrarenal stones without hydronephrosis or ureteral stone. 2.  Right greater than left perinephric fat stranding which is nonspecific though consider pyelonephritis in the appropriate clinical setting. 3. Small free fluid in the  pelvis Electronically Signed   By: Jasmine PangKim  Fujinaga M.D.   On: 06/14/2021 15:55    Recent Results (from the past 2160 hour(s))  Cytology - PAP     Status: None   Collection Time: 04/16/21  4:11 PM  Result Value Ref Range   High risk HPV Negative    Adequacy      Satisfactory for evaluation; transformation zone component PRESENT.   Diagnosis      - Negative for intraepithelial lesion or malignancy (NILM)   Comment Normal Reference Range HPV - Negative   FSH     Status: None   Collection Time: 04/16/21  4:13 PM  Result Value Ref Range   FSH 25.9 mIU/mL    Comment:                     Adult Female:                       Follicular phase      3.5 -  12.5                       Ovulation phase       4.7 -  21.5                       Luteal phase          1.7 -   7.7                       Postmenopausal       25.8 - 134.8   Estradiol     Status: None   Collection Time: 04/16/21  4:13 PM  Result Value Ref Range   Estradiol 17.4 pg/mL    Comment:                     Adult Female:                       Follicular phase   12.5 -   166.0                       Ovulation phase    85.8 -   498.0                       Luteal phase       43.8 -   211.0                       Postmenopausal     <6.0 -    54.7                     Pregnancy                       1st trimester     215.0 - >4300.0 Roche ECLIA methodology   Prolactin     Status: None   Collection Time: 04/16/21  4:13 PM  Result Value Ref Range   Prolactin 22.9 4.8 - 23.3 ng/mL  Basic metabolic panel     Status: Abnormal   Collection Time: 06/14/21 12:43 PM  Result Value Ref  Range   Sodium 140 135 - 145 mmol/L   Potassium 3.8 3.5 - 5.1 mmol/L   Chloride 106 98 - 111 mmol/L   CO2 25 22 - 32 mmol/L   Glucose, Bld 117 (H) 70 - 99 mg/dL    Comment: Glucose reference range applies only to samples taken after fasting for at least 8 hours.   BUN 12 6 - 20 mg/dL   Creatinine, Ser 4.58 0.44 - 1.00 mg/dL   Calcium 8.7 (L) 8.9 - 10.3  mg/dL   GFR, Estimated >09 >98 mL/min    Comment: (NOTE) Calculated using the CKD-EPI Creatinine Equation (2021)    Anion gap 9 5 - 15    Comment: Performed at [facility], [address] [place], [place] [address]  CBC     Status: Abnormal   Collection Time: 06/14/21 12:43 PM  Result Value Ref Range   WBC 9.9 4.0 - 10.5 K/uL   RBC 3.81 (L) 3.87 - 5.11 MIL/uL   Hemoglobin 7.5 (L) 12.0 - 15.0 g/dL    Comment: Reticulocyte Hemoglobin testing may be clinically indicated, consider ordering this additional test [id number]    HCT 26.3 (L) 36.0 - 46.0 %   MCV 69.0 (L) 80.0 - 100.0 fL   MCH 19.7 (L) 26.0 - 34.0 pg   MCHC 28.5 (L) 30.0 - 36.0 g/dL   RDW 41.9 (H) 37.9 - 02.4 %   Platelets 300 150 - 400 K/uL   nRBC 0.0 0.0 - 0.2 %    Comment: Performed at [facility], [address] [place], [place] [address]  Urinalysis, Complete w Microscopic Urine, Clean Catch     Status: Abnormal   Collection Time: 06/14/21 12:45 PM  Result Value Ref Range   Color, Urine YELLOW (A) YELLOW   APPearance HAZY (A) CLEAR   Specific Gravity, Urine 1.010 1.005 - 1.030   pH 6.0 5.0 - 8.0   Glucose, UA NEGATIVE NEGATIVE mg/dL   Hgb urine dipstick LARGE (A) NEGATIVE   Bilirubin Urine NEGATIVE NEGATIVE   Ketones, ur NEGATIVE NEGATIVE mg/dL   Protein, ur NEGATIVE NEGATIVE mg/dL   Nitrite NEGATIVE NEGATIVE   Leukocytes,Ua SMALL (A) NEGATIVE   RBC / HPF >50 (H) 0 - 5 RBC/hpf   WBC, UA 21-50 0 - 5 WBC/hpf   Bacteria, UA MANY (A) NONE SEEN   Squamous Epithelial / LPF 6-10 0 - 5   Mucus PRESENT     Comment: Performed at [facility], [address] [place], [place] [address]  POC urine preg, ED     Status: None   Collection Time: 06/14/21 12:50 PM  Result Value Ref Range   Preg Test, Ur NEGATIVE NEGATIVE    Comment:        THE SENSITIVITY OF THIS METHODOLOGY IS >24 mIU/mL   Resp Panel by RT-PCR (Flu A&B, Covid) Nasopharyngeal Swab     Status: None   Collection Time:  06/14/21  2:02 PM   Specimen: Nasopharyngeal Swab; Nasopharyngeal(NP) swabs in vial transport medium  Result Value Ref Range   SARS Coronavirus 2 by RT PCR NEGATIVE NEGATIVE    Comment: (NOTE) SARS-CoV-2 target nucleic acids are NOT DETECTED.  The SARS-CoV-2 RNA is generally detectable in upper respiratory specimens during the acute phase of infection. The lowest concentration of SARS-CoV-2 viral copies this assay can detect is 138 copies/mL. A negative result does not preclude SARS-Cov-2 infection and should not be used as the sole basis for treatment or other patient  management decisions. A negative result may occur with  improper specimen collection/handling, submission of specimen other than nasopharyngeal swab, presence of viral mutation(s) within the areas targeted by this assay, and inadequate number of viral copies(<138 copies/mL). A negative result must be combined with clinical observations, patient history, and epidemiological information. The expected result is Negative.  Fact Sheet for Patients:  https://www.fda.gov/media/152166/download  Fact Sheet for Healthcare Providers:  https://www.fda.gov/media/152162/download  This test is no t yet approved or cleared by the [place] FDA and  has been authorized for detection and/or diagnosis of SARS-CoV-2 by FDA under an Emergency Use Authorization (EUA). This EUA will remain  in effect (meaning this test can be used) for the duration of the COVID-19 declaration under Section 564(b)(1) of the Act, 21 U.S.C.section 360bbb-3(b)(1), unless the authorization is terminated  or revoked sooner.       Influenza A by PCR NEGATIVE NEGATIVE   Influenza B by PCR NEGATIVE NEGATIVE    Comment: (NOTE) The Xpert Xpress SARS-CoV-2/FLU/RSV plus assay is intended as an aid in the diagnosis of influenza from Nasopharyngeal swab specimens and should not be used as a sole basis for treatment. Nasal washings and aspirates are  unacceptable for Xpert Xpress SARS-CoV-2/FLU/RSV testing.  Fact Sheet for Patients: https://www.fda.gov/media/152166/download  Fact Sheet for Healthcare Providers: https://www.fda.gov/media/152162/download  This test is not yet approved or cleared by the [place] FDA and has been authorized for detection and/or diagnosis of SARS-CoV-2 by FDA under an Emergency Use Authorization (EUA). This EUA will remain in effect (meaning this test can be used) for the duration of the COVID-19 declaration under Section 564(b)(1) of the Act, 21 U.S.C. section 360bbb-3(b)(1), unless the authorization is terminated or revoked.  Performed at [facility], [address] [place], [place] [address]   CK     Status: None   Collection Time: 06/14/21  2:02 PM  Result Value Ref Range   Total CK 70 38 - 234 U/L    Comment: Performed at [facility], [address] [place], [place] [address]  Lactic acid, plasma     Status: None   Collection Time: 06/14/21  2:22 PM  Result Value Ref Range   Lactic Acid, Venous 0.7 0.5 - 1.9 mmol/L    Comment: Performed at [facility], [address] [place], [place] [address]    Assessment: 44 y.o. G1P0 AUB follow up   Plan: Problem List Items Addressed This Visit   None Visit Diagnoses     Abnormal uterine bleeding (AUB)    -  Primary   Chronic blood loss anemia           1)  AUB - PALM-COIEN system work up complete and normal.  Laboratory evaluation showing some mild elevation in FSH so most likely etiology is perimenopausal bleeding..  Management option discussed including expectant, hormonal (oral, depo provera, IUD), endometrial ablation, and hysterectomy - patient interested in ablation.  The patient was counseled on the overall effectiveness of endometrial ablation in achieving amenorrhea.  She is aware that some patient may continue to have menstrual cycles although these are generally greatly reduced in flow.  In  addition she was quoted a failure rate for endometrial ablation of approximately 25% within the frist 4 years, but these failures may happen at any time during or after the initial 4 year postop period.  She is aware that pregnancy is contra-indicated in the setting of prior endometrial ablation, and that ablation itself does not confer any contraceptive   benefit.  She will therefore need to continue to rely on some means of contraception following the procedure.  Although rare and generally confined to patient who have undergone prior tubal ligatoin, post-ablation tubal sterilizaton syndrome (PATSS) may also occur with no reliable incidence rates as the majority of published literature is limited to case reports.   Prior to being considered a candidate for Novasure ablation she will need to undergo endometrial biopsy to rule out endometrial hyperplasia or malignancy as the cause of her bleeding, have an up to date pap on record, and undergo transvaginal ultrasound to verify the absence of focal endometrial lesion which may need to be addressed prior to proceeding with ablation.  In addition she is aware that the device is limited for use in women with a normal uterine cavity and uterine leiomyomata <3cm in size.  If present leiomyomata may increase the long-term failure rate of the procedure.  In rare instances the presence of a uterine septum or arcuate uterus, which may not be readily apparent on preoperative ultrasound, may necessitate the procedure to be aborted.    2) A total of 15 minutes were spent in face-to-face contact with the patient during this encounter with over half of that time devoted to counseling and coordination of care.  3)Return well be called with surgery date.    Vena Austria, MD, [facility], [facility] 06/27/2021, 8:51 PM

## 2021-06-29 ENCOUNTER — Telehealth: Payer: Self-pay

## 2021-06-29 NOTE — Telephone Encounter (Signed)
-----   Message from Vena Austria, MD sent at 06/27/2021  8:49 PM EDT ----- Regarding: Surgery Surgery Booking Request Patient Full Name:  Kara Carter  MRN: [id number]  DOB: 09-13-1976  Surgeon: Vena Austria, MD  Requested Surgery Date and Time: 3-6 weeks Primary Diagnosis AND Code: Abnormal uterine bleeding Secondary Diagnosis and Code: Acute blood loss anemia Surgical Procedure: Hysteroscopy D&C with Ablation RNFA Requested?: No L&D Notification: No Admission Status: same day surgery Length of Surgery: 50 min Special Case Needs: No H&P: Yes at least 7 days prior to procedure needs endometrial biopsy Phone Interview???:  No Interpreter: No Medical Clearance:  No Special Scheduling Instructions: No Any known health/anesthesia issues, diabetes, sleep apnea, latex allergy, defibrillator/pacemaker?: No Acuity: P2   (P1 highest, P2 delay may cause harm, P3 low, elective gyn, P4 lowest) Post op follow up visits:

## 2021-06-29 NOTE — Telephone Encounter (Signed)
Called patient to schedule Hysteroscopy D&C with ablation w [facility]  DOS 8/18  H&P 8/12 @ 3:50 - endo biopsy   Pre-admit phone call appointment to be requested - date and time will be included on H&P paper work. Also all appointments will be updated on pt MyChart. Explained that this appointment has a call window. Based on the time scheduled will indicate if the call will be received within a 4 hour window before 1:00 or after.  Advised that pt may also receive calls from the hospital pharmacy and pre-service center.  Confirmed pt has [employer] as Editor, commissioning. No secondary insurance.

## 2021-07-11 ENCOUNTER — Telehealth: Payer: Self-pay

## 2021-07-11 NOTE — Telephone Encounter (Signed)
Called pt to advise that I had to move her appt time on 8/12 to 1:15 as AMS will not be available later that afternoon.

## 2021-07-20 ENCOUNTER — Encounter: Payer: BLUE CROSS/BLUE SHIELD | Admitting: Obstetrics and Gynecology

## 2021-07-20 ENCOUNTER — Ambulatory Visit (INDEPENDENT_AMBULATORY_CARE_PROVIDER_SITE_OTHER): Payer: BC Managed Care – PPO | Admitting: Obstetrics and Gynecology

## 2021-07-20 ENCOUNTER — Other Ambulatory Visit (HOSPITAL_COMMUNITY)
Admission: RE | Admit: 2021-07-20 | Discharge: 2021-07-20 | Disposition: A | Discharge status: Home / Self Care | Payer: BC Managed Care – PPO | Source: Ambulatory Visit | Attending: Obstetrics and Gynecology | Admitting: Obstetrics and Gynecology

## 2021-07-20 ENCOUNTER — Encounter: Payer: Self-pay | Admitting: Obstetrics and Gynecology

## 2021-07-20 ENCOUNTER — Other Ambulatory Visit: Payer: Self-pay

## 2021-07-20 VITALS — BP 120/76 | Ht 61.0 in | Wt 184.0 lb

## 2021-07-20 DIAGNOSIS — N939 Abnormal uterine and vaginal bleeding, unspecified: Secondary | ICD-10-CM

## 2021-07-20 DIAGNOSIS — Z01818 Encounter for other preprocedural examination: Secondary | ICD-10-CM

## 2021-07-23 ENCOUNTER — Other Ambulatory Visit: Payer: Self-pay

## 2021-07-23 ENCOUNTER — Encounter
Admission: RE | Admit: 2021-07-23 | Discharge: 2021-07-23 | Disposition: A | Discharge status: Home / Self Care | Payer: BC Managed Care – PPO | Source: Ambulatory Visit | Attending: Obstetrics and Gynecology | Admitting: Obstetrics and Gynecology

## 2021-07-23 HISTORY — DX: Abnormal uterine and vaginal bleeding, unspecified: N93.9

## 2021-07-23 HISTORY — DX: Anemia, unspecified: D64.9

## 2021-07-23 NOTE — Patient Instructions (Signed)
INSTRUCTIONS FOR SURGERY     Your surgery is scheduled for:   Thursday, AUGUST 18TH     To find out your arrival time for the day of surgery,          please call [phone] between 1 pm and 3 pm on :  Wednesday, AUGUST 17TH     When you arrive for surgery, report to the FIRST FLOOR OF THE MEDICAL MALL. CHECK IN AT     THE REGISTRATION DESK.  ONCE THEY HAVE COMPLETED THEIR PROCESS, PROCEED      TO THE SECOND FLOOR AND SIGN IN AT THE SURGERY DESK.    REMEMBER: Instructions that are not followed completely may result in serious medical risk,  up to and including death, or upon the discretion of your surgeon and anesthesiologist,            your surgery may need to be rescheduled.  __X__ 1. Do not eat food after midnight the night before your procedure.                    No gum, candy, lozenger, tic tacs, tums or hard candies.                  ABSOLUTELY NOTHING SOLID IN YOUR MOUTH AFTER MIDNIGHT                    You may drink unlimited clear liquids up to 2 hours before you are scheduled to arrive for surgery.                   Do not drink anything within those 2 hours unless you need to take medicine, then take the                   smallest amount you need.  Clear liquids include:  water, apple juice without pulp,                   any flavor Gatorade, Black coffee, black tea.  Sugar may be added but no dairy/ honey /lemon.                        Broth and jello is not considered a clear liquid.  __x__  2. On the morning of surgery, please brush your teeth with toothpaste and water. You may rinse with                  mouthwash if you wish but DO NOT SWALLOW TOOTHPASTE OR MOUTHWASH  __X___3. NO alcohol for 24 hours before or after surgery.  __x___ 4.  Do NOT smoke or use e-cigarettes for 24 HOURS PRIOR TO SURGERY.                      DO NOT  Use any chewable tobacco products for at least 6 hours prior to surgery.  __x___  5. If you start any new medication after this appointment and prior to surgery, please                   Bring  it with you on the day of surgery.  ___x__ 6. Notify your doctor if there is any change in your medical condition, such as fever,                   infection, vomitting, diarrhea or any open sores.  __x___ 7.  USE ANTIBACTERIAL SOAP as instructed, the night before surgery and the day of surgery.                   Once you have washed with this soap, do NOT use any of the following: Powders, perfumes                    or lotions. Please do not wear make up, hairpins, clips or nail polish. You MAY wear deodorant.                                              Women need to shave 48 hours prior to surgery.                   DO NOT wear ANY jewelry on the day of surgery. If there are rings that are too tight to                    remove easily, please address this prior to the surgery day. Piercings need to be removed.                                                                     NO METAL ON YOUR BODY.                    Do NOT bring any valuables.  If you came to Pre-Admit testing then you will not need license,                     insurance card or credit card.  If you will be staying overnight, please either leave your things in                     the car or have your family be responsible for these items.                     [facility] IS NOT RESPONSIBLE FOR BELONGINGS OR VALUABLES.  ___X__ 8. DO NOT wear contact lenses on surgery day.  You may not have dentures,                     Hearing aides, contacts or glasses in the operating room. These items can be                    Placed in the Recovery Room to receive immediately after surgery.  __x___ 9. IF YOU ARE SCHEDULED TO GO HOME ON THE SAME DAY, YOU MUST                   Have someone to drive you home and to stay with you  for the first  24 hours.                    Have an arrangement prior to arriving on surgery  day.  ___x__ 10. Take the following medications on the morning of surgery with a sip of water:                              1. NOTHING  __X__  12. STOP ALL ASPIRIN PRODUCTS AS OF TODAY, 07/23/21.                       THIS INCLUDES BC POWDERS / GOODIES POWDER  __x___ 13. STOP Anti-inflammatories as of TODAY, 07/23/21                      This includes IBUPROFEN / MOTRIN / ADVIL / ALEVE/ NAPROXYN                    YOU MAY TAKE TYLENOL ANY TIME PRIOR TO SURGERY.  __X____18.  Wear clean and comfortable clothing to the hospital. HAVE SOMETHING                       THAT IS LOOSE AROUND YOUR ABDOMEN.  REMEMBER TO BRING PHONE NUMBERS FOR YOUR CONTACT PEOPLE.

## 2021-07-24 ENCOUNTER — Other Ambulatory Visit
Admission: RE | Admit: 2021-07-24 | Discharge: 2021-07-24 | Disposition: A | Discharge status: Home / Self Care | Payer: BC Managed Care – PPO | Source: Ambulatory Visit | Attending: Obstetrics and Gynecology | Admitting: Obstetrics and Gynecology

## 2021-07-24 DIAGNOSIS — Z888 Allergy status to other drugs, medicaments and biological substances status: Secondary | ICD-10-CM | POA: Diagnosis not present

## 2021-07-24 DIAGNOSIS — Z01812 Encounter for preprocedural laboratory examination: Secondary | ICD-10-CM | POA: Insufficient documentation

## 2021-07-24 DIAGNOSIS — N939 Abnormal uterine and vaginal bleeding, unspecified: Secondary | ICD-10-CM | POA: Diagnosis not present

## 2021-07-24 DIAGNOSIS — D62 Acute posthemorrhagic anemia: Secondary | ICD-10-CM | POA: Diagnosis not present

## 2021-07-24 LAB — CBC
HCT: 29.3 % — ABNORMAL LOW (ref 36.0–46.0)
Hemoglobin: 8.6 g/dL — ABNORMAL LOW (ref 12.0–15.0)
MCH: 20.5 pg — ABNORMAL LOW (ref 26.0–34.0)
MCHC: 29.4 g/dL — ABNORMAL LOW (ref 30.0–36.0)
MCV: 69.8 fL — ABNORMAL LOW (ref 80.0–100.0)
Platelets: 400 10*3/uL (ref 150–400)
RBC: 4.2 MIL/uL (ref 3.87–5.11)
RDW: 18.6 % — ABNORMAL HIGH (ref 11.5–15.5)
WBC: 7.4 10*3/uL (ref 4.0–10.5)
nRBC: 0 % (ref 0.0–0.2)

## 2021-07-24 LAB — TYPE AND SCREEN
ABO/RH(D): O POS
Antibody Screen: NEGATIVE

## 2021-07-24 LAB — SURGICAL PATHOLOGY

## 2021-07-26 ENCOUNTER — Other Ambulatory Visit: Payer: Self-pay

## 2021-07-26 ENCOUNTER — Ambulatory Visit: Payer: BC Managed Care – PPO | Admitting: Certified Registered Nurse Anesthetist

## 2021-07-26 ENCOUNTER — Ambulatory Visit
Admission: RE | Admit: 2021-07-26 | Discharge: 2021-07-26 | Disposition: A | Discharge status: Home / Self Care | Payer: BC Managed Care – PPO | Attending: Obstetrics and Gynecology | Admitting: Obstetrics and Gynecology

## 2021-07-26 ENCOUNTER — Encounter: Payer: Self-pay | Admitting: Obstetrics and Gynecology

## 2021-07-26 ENCOUNTER — Encounter
Admission: RE | Disposition: A | Discharge status: Home / Self Care | Payer: Self-pay | Source: Home / Self Care | Attending: Obstetrics and Gynecology

## 2021-07-26 DIAGNOSIS — Z888 Allergy status to other drugs, medicaments and biological substances status: Secondary | ICD-10-CM | POA: Diagnosis not present

## 2021-07-26 DIAGNOSIS — N939 Abnormal uterine and vaginal bleeding, unspecified: Secondary | ICD-10-CM | POA: Diagnosis not present

## 2021-07-26 DIAGNOSIS — D62 Acute posthemorrhagic anemia: Secondary | ICD-10-CM | POA: Diagnosis not present

## 2021-07-26 HISTORY — PX: DILITATION & CURRETTAGE/HYSTROSCOPY WITH NOVASURE ABLATION: SHX5568

## 2021-07-26 LAB — ABO/RH: ABO/RH(D): O POS

## 2021-07-26 LAB — POCT PREGNANCY, URINE: Preg Test, Ur: NEGATIVE

## 2021-07-26 SURGERY — DILATATION & CURETTAGE/HYSTEROSCOPY WITH NOVASURE ABLATION
Anesthesia: General

## 2021-07-26 MED ORDER — ONDANSETRON HCL 4 MG/2ML IJ SOLN: 4.0000 mg | Freq: Once | INTRAMUSCULAR | Status: DC | PRN

## 2021-07-26 MED ORDER — OXYCODONE HCL 5 MG PO TABS
ORAL_TABLET | ORAL | Status: AC
  Filled 2021-07-26: qty 1

## 2021-07-26 MED ORDER — CHLORHEXIDINE GLUCONATE 0.12 % MT SOLN: 15.0000 mL | Freq: Once | OROMUCOSAL | Status: AC

## 2021-07-26 MED ORDER — FENTANYL CITRATE (PF) 100 MCG/2ML IJ SOLN
INTRAMUSCULAR | Status: AC
  Filled 2021-07-26: qty 2

## 2021-07-26 MED ORDER — FENTANYL CITRATE (PF) 100 MCG/2ML IJ SOLN
INTRAMUSCULAR | Status: AC
  Administered 2021-07-26: 25 ug via INTRAVENOUS
  Filled 2021-07-26: qty 2

## 2021-07-26 MED ORDER — ONDANSETRON HCL 4 MG/2ML IJ SOLN
INTRAMUSCULAR | Status: AC
  Filled 2021-07-26: qty 2

## 2021-07-26 MED ORDER — OXYCODONE HCL 5 MG/5ML PO SOLN: 5.0000 mg | Freq: Once | ORAL | Status: AC | PRN

## 2021-07-26 MED ORDER — IBUPROFEN 600 MG PO TABS: 600.0000 mg | ORAL_TABLET | Freq: Four times a day (QID) | ORAL | 0 refills | Status: AC | PRN

## 2021-07-26 MED ORDER — LIDOCAINE HCL (CARDIAC) PF 100 MG/5ML IV SOSY
PREFILLED_SYRINGE | INTRAVENOUS | Status: DC | PRN
  Administered 2021-07-26: 60 mg via INTRAVENOUS

## 2021-07-26 MED ORDER — FAMOTIDINE 20 MG PO TABS: 20.0000 mg | ORAL_TABLET | Freq: Once | ORAL | Status: AC

## 2021-07-26 MED ORDER — LIDOCAINE HCL (PF) 2 % IJ SOLN
INTRAMUSCULAR | Status: AC
  Filled 2021-07-26: qty 5

## 2021-07-26 MED ORDER — FENTANYL CITRATE (PF) 100 MCG/2ML IJ SOLN
INTRAMUSCULAR | Status: DC | PRN
  Administered 2021-07-26: 25 ug via INTRAVENOUS
  Administered 2021-07-26: 50 ug via INTRAVENOUS
  Administered 2021-07-26: 25 ug via INTRAVENOUS

## 2021-07-26 MED ORDER — FAMOTIDINE 20 MG PO TABS
ORAL_TABLET | ORAL | Status: AC
  Administered 2021-07-26: 20 mg via ORAL
  Filled 2021-07-26: qty 1

## 2021-07-26 MED ORDER — FENTANYL CITRATE (PF) 100 MCG/2ML IJ SOLN
25.0000 ug | INTRAMUSCULAR | Status: DC | PRN
  Administered 2021-07-26: 25 ug via INTRAVENOUS

## 2021-07-26 MED ORDER — PROPOFOL 10 MG/ML IV BOLUS
INTRAVENOUS | Status: AC
  Filled 2021-07-26: qty 20

## 2021-07-26 MED ORDER — DEXAMETHASONE SODIUM PHOSPHATE 10 MG/ML IJ SOLN
INTRAMUSCULAR | Status: AC
  Filled 2021-07-26: qty 1

## 2021-07-26 MED ORDER — MIDAZOLAM HCL 2 MG/2ML IJ SOLN
INTRAMUSCULAR | Status: AC
  Filled 2021-07-26: qty 2

## 2021-07-26 MED ORDER — ACETAMINOPHEN 10 MG/ML IV SOLN: 1000.0000 mg | Freq: Once | INTRAVENOUS | Status: DC | PRN

## 2021-07-26 MED ORDER — SILVER NITRATE-POT NITRATE 75-25 % EX MISC
CUTANEOUS | Status: DC | PRN
  Administered 2021-07-26: 2

## 2021-07-26 MED ORDER — KETOROLAC TROMETHAMINE 30 MG/ML IJ SOLN
INTRAMUSCULAR | Status: AC
  Filled 2021-07-26: qty 1

## 2021-07-26 MED ORDER — HYDROCODONE-ACETAMINOPHEN 5-325 MG PO TABS: 1.0000 | ORAL_TABLET | Freq: Four times a day (QID) | ORAL | 0 refills | Status: DC | PRN

## 2021-07-26 MED ORDER — 0.9 % SODIUM CHLORIDE (POUR BTL) OPTIME
TOPICAL | Status: DC | PRN
  Administered 2021-07-26: 500 mL

## 2021-07-26 MED ORDER — DEXAMETHASONE SODIUM PHOSPHATE 10 MG/ML IJ SOLN
INTRAMUSCULAR | Status: DC | PRN
  Administered 2021-07-26: 10 mg via INTRAVENOUS

## 2021-07-26 MED ORDER — ORAL CARE MOUTH RINSE: 15.0000 mL | Freq: Once | OROMUCOSAL | Status: AC

## 2021-07-26 MED ORDER — POVIDONE-IODINE 10 % EX SWAB
2.0000 "application " | Freq: Once | CUTANEOUS | Status: AC
  Administered 2021-07-26: 2 via TOPICAL

## 2021-07-26 MED ORDER — MIDAZOLAM HCL 2 MG/2ML IJ SOLN
INTRAMUSCULAR | Status: DC | PRN
  Administered 2021-07-26: 2 mg via INTRAVENOUS

## 2021-07-26 MED ORDER — OXYCODONE HCL 5 MG PO TABS
5.0000 mg | ORAL_TABLET | Freq: Once | ORAL | Status: AC | PRN
  Administered 2021-07-26: 5 mg via ORAL

## 2021-07-26 MED ORDER — KETOROLAC TROMETHAMINE 30 MG/ML IJ SOLN
INTRAMUSCULAR | Status: DC | PRN
  Administered 2021-07-26: 30 mg via INTRAVENOUS

## 2021-07-26 MED ORDER — ONDANSETRON HCL 4 MG/2ML IJ SOLN
INTRAMUSCULAR | Status: DC | PRN
  Administered 2021-07-26: 4 mg via INTRAVENOUS

## 2021-07-26 MED ORDER — CHLORHEXIDINE GLUCONATE 0.12 % MT SOLN
OROMUCOSAL | Status: AC
  Administered 2021-07-26: 15 mL via OROMUCOSAL
  Filled 2021-07-26: qty 15

## 2021-07-26 MED ORDER — SODIUM CHLORIDE 0.9 % IR SOLN
Status: DC | PRN
  Administered 2021-07-26: 1500 mL

## 2021-07-26 MED ORDER — LACTATED RINGERS IV SOLN: INTRAVENOUS | Status: DC

## 2021-07-26 MED ORDER — PROPOFOL 10 MG/ML IV BOLUS
INTRAVENOUS | Status: DC | PRN
  Administered 2021-07-26: 170 mg via INTRAVENOUS

## 2021-07-26 SURGICAL SUPPLY — 23 items
ABLATOR SURESOUND NOVASURE (ABLATOR) ×4 IMPLANT
BACTOSHIELD CHG 4% 4OZ (MISCELLANEOUS) ×1
CANISTER SUC SOCK COL 7IN (MISCELLANEOUS) IMPLANT
CATH ROBINSON RED A/P 16FR (CATHETERS) ×2 IMPLANT
DEVICE MYOSURE LITE (MISCELLANEOUS) IMPLANT
DEVICE MYOSURE REACH (MISCELLANEOUS) IMPLANT
ELECT REM PT RETURN 9FT ADLT (ELECTROSURGICAL) ×2
ELECTRODE REM PT RTRN 9FT ADLT (ELECTROSURGICAL) ×1 IMPLANT
GAUZE 4X4 16PLY ~~LOC~~+RFID DBL (SPONGE) ×2 IMPLANT
GLOVE SURG ENC MOIS LTX SZ7 (GLOVE) ×2 IMPLANT
GOWN STRL REUS W/ TWL LRG LVL3 (GOWN DISPOSABLE) ×2 IMPLANT
GOWN STRL REUS W/TWL LRG LVL3 (GOWN DISPOSABLE) ×4
IV NS IRRIG 3000ML ARTHROMATIC (IV SOLUTION) ×2 IMPLANT
KIT PROCEDURE FLUENT (KITS) IMPLANT
KIT TURNOVER CYSTO (KITS) ×2 IMPLANT
MANIFOLD NEPTUNE II (INSTRUMENTS) ×2 IMPLANT
PACK DNC HYST (MISCELLANEOUS) ×2 IMPLANT
PAD OB MATERNITY 4.3X12.25 (PERSONAL CARE ITEMS) ×2 IMPLANT
PAD PREP 24X41 OB/GYN DISP (PERSONAL CARE ITEMS) ×2 IMPLANT
SCRUB CHG 4% DYNA-HEX 4OZ (MISCELLANEOUS) ×1 IMPLANT
SEAL ROD LENS SCOPE MYOSURE (ABLATOR) ×2 IMPLANT
TOWEL OR 17X26 4PK STRL BLUE (TOWEL DISPOSABLE) ×2 IMPLANT
TUBING CONNECTING 10 (TUBING) ×2 IMPLANT

## 2021-07-26 NOTE — Transfer of Care (Signed)
Immediate Anesthesia Transfer of Care Note  Patient: Kara Carter  Procedure(s) Performed: DILATATION & CURETTAGE/HYSTEROSCOPY WITH NOVASURE ABLATION  Patient Location: PACU  Anesthesia Type:General  Level of Consciousness: drowsy  Airway & Oxygen Therapy: Patient Spontanous Breathing and Patient connected to face mask oxygen  Post-op Assessment: Report given to RN and Post -op Vital signs reviewed and stable  Post vital signs: Reviewed and stable  Last Vitals:  Vitals Value Taken Time  BP 157/82 07/26/21 1108  Temp    Pulse 62 07/26/21 1109  Resp 21 07/26/21 1109  SpO2 100 % 07/26/21 1109  Vitals shown include unvalidated device data.  Last Pain:  Vitals:   07/26/21 0909  TempSrc: Oral  PainSc: 0-No pain         Complications: No notable events documented.

## 2021-07-26 NOTE — Anesthesia Preprocedure Evaluation (Signed)
Anesthesia Evaluation  Patient identified by MRN, date of birth, ID band Patient awake    Reviewed: Allergy & Precautions, NPO status , Patient's Chart, lab work & pertinent test results  History of Anesthesia Complications Negative for: history of anesthetic complications  Airway Mallampati: III  TM Distance: >3 FB Neck ROM: Full    Dental no notable dental hx. (+) Teeth Intact   Pulmonary neg pulmonary ROS, neg sleep apnea, neg COPD, Patient abstained from smoking.Not current smoker,    Pulmonary exam normal breath sounds clear to auscultation       Cardiovascular Exercise Tolerance: Good METS(-) hypertension(-) CAD and (-) Past MI negative cardio ROS  (-) dysrhythmias  Rhythm:Regular Rate:Normal - Systolic murmurs    Neuro/Psych negative neurological ROS  negative psych ROS   GI/Hepatic neg GERD  ,(+)     (-) substance abuse  ,   Endo/Other  neg diabetes  Renal/GU negative Renal ROS     Musculoskeletal   Abdominal   Peds  Hematology  (+) Blood dyscrasia, anemia ,   Anesthesia Other Findings Past Medical History: No date: Abnormal uterine bleeding No date: Anemia 06/2021: History of kidney stones  Reproductive/Obstetrics                             Anesthesia Physical Anesthesia Plan  ASA: 2  Anesthesia Plan: General   Post-op Pain Management:    Induction: Intravenous  PONV Risk Score and Plan: 4 or greater and Ondansetron, Dexamethasone and Midazolam  Airway Management Planned: LMA  Additional Equipment: None  Intra-op Plan:   Post-operative Plan: Extubation in OR  Informed Consent: I have reviewed the patients History and Physical, chart, labs and discussed the procedure including the risks, benefits and alternatives for the proposed anesthesia with the patient or authorized representative who has indicated his/her understanding and acceptance.     Dental  advisory given  Plan Discussed with: CRNA and Surgeon  Anesthesia Plan Comments: (Discussed risks of anesthesia with patient, including PONV, sore throat, lip/dental damage. Rare risks discussed as well, such as cardiorespiratory and neurological sequelae, and allergic reactions. Patient understands.)        Anesthesia Quick Evaluation

## 2021-07-26 NOTE — Op Note (Signed)
Preoperative Diagnosis: 1) 45 y.o.  with abnormal uterine bleeding  Postoperative Diagnosis: 1) 45 y.o. with abnormal uterine bleeding  Operation Performed: Hysteroscopy, dilation and curettage, novasure endometrial ablation  Indication: The patient was previously counseled on the overall effectiveness of the device in achieving amenorrhea.  She is aware that some patient may continue to have menstrual cycles although these are generally greatly reduced in flow.  In addition she was quoted a failure rate for endometrial ablation of approximately 25% within the frist 4 years, but these failures may happen at any time during or after the initial 4 year postop period.  She is aware that pregnancy is contra-indicated in the setting of prior endometrial ablation, and that ablation itself does not confer and contraceptive benefits and that she will need to continue to rely on some means of contraception following the procedure.  Although rare and generally confined to patient who have undergone prior tubal ligatoin, post-ablation tubal sterilizaton syndrome (PATSS) may also occur.   Surgeon: Vena Austria, MD  Anesthesia: General  Preoperative Antibiotics: none  Estimated Blood Loss: 25 mL  IV Fluids:  Urine Output::  Drains or Tubes: none  Implants: none  Specimens Removed: Endometrial curettings  Complications: none  Intraoperative Findings: Normal appearing vagina mucosa and cervix.  Normal uterine cavity.  Good coverage of the uterine cavity on post-tablation hysteroscopy with ablation of the uterine cornua and sparing of the endocervix.   Patient Condition: stable  PROCEDURE IN DETAIL: After informed consent was obtained, the patient was taken to the operating room where anesthesia was obtained without difficulty. The patient was positioned in the dorsal lithotomy position using Allen stirrups. The patient's bladder was decompressed using a red rubber catheter. The  patient was examined under anesthesia, with the above noted findings.  An operative speculum was placed inside the patient's vagina, the cervix was adequately visualized, and the the anterior lip of the cervix was grasped with a single tooth tenaculum. The uterine cavity was sounded to 10 cm, and then the cervix was progressively dilated to  42mm using Pratt dilators. The 0 degree hysteroscope was introduced through the cervix and advanced under direct visualization in the uterine cavity.  Normal saline was used as the distending fluid during the hysteroscopy.  This revealed the above noted intracavitary findings. The cervical length was obtained by retracting the hysteroscope to the level of the internal cervical os and marking that point on the hysteroscopy.  This measurement yielded a value of 5cm, yielding a total cavity length of 5cm based on the previously obtained sounding measurement  The hystersocope was removed and the uterine cavity was curetted until a gritty texture was noted, the resulting endometrial curetting was sent to pathology.    The NovaSure device was then placed without difficulty. The Novasure array was deployed and seated, with a resulting uterine width of 4.7cm. The NovaSure device passed the cavity assessment test.  Following this the device was activaed at a power of with a total ablation time of 1:25 seconds. The NovaSure device was removed and repeat hysteroscopy reveals poor coverage of the uterine fundus.  A second NovaSure device was used using same measurement and ablation time of 1:08 second.  Repeat hysteroscopy revealed complete ablation coverage of the entire uterus and no perforation or injury. Hysteroscope is removed with minimal discrepancy of fluid.   Tenaculum was removed with excellent hemostasis noted after application of silver nitrate to the tenaculum entry sites. She was then taken out of  dorsal lithotomy. Hemostasis noted.  The patient tolerated the procedure  well. Sponge, lap and needle counts were correct times two. The patient was taken to recovery room in excellent condition.

## 2021-07-26 NOTE — Discharge Instructions (Signed)
AMBULATORY SURGERY  ?DISCHARGE INSTRUCTIONS ? ? ?The drugs that you were given will stay in your system until tomorrow so for the next 24 hours you should not: ? ?Drive an automobile ?Make any legal decisions ?Drink any alcoholic beverage ? ? ?You may resume regular meals tomorrow.  Today it is better to start with liquids and gradually work up to solid foods. ? ?You may eat anything you prefer, but it is better to start with liquids, then soup and crackers, and gradually work up to solid foods. ? ? ?Please notify your doctor immediately if you have any unusual bleeding, trouble breathing, redness and pain at the surgery site, drainage, fever, or pain not relieved by medication. ? ? ? ?Additional Instructions: ? ? ? ?Please contact your physician with any problems or Same Day Surgery at [phone], Monday through Friday 6 am to 4 pm, or [facility] number at [phone].  ?

## 2021-07-26 NOTE — Anesthesia Postprocedure Evaluation (Signed)
Anesthesia Post Note  Patient: Kara Carter  Procedure(s) Performed: DILATATION & CURETTAGE/HYSTEROSCOPY WITH NOVASURE ABLATION  Patient location during evaluation: PACU Anesthesia Type: General Level of consciousness: awake and alert Pain management: pain level controlled Vital Signs Assessment: post-procedure vital signs reviewed and stable Respiratory status: spontaneous breathing, nonlabored ventilation, respiratory function stable and patient connected to nasal cannula oxygen Cardiovascular status: blood pressure returned to baseline and stable Postop Assessment: no apparent nausea or vomiting Anesthetic complications: no   No notable events documented.   Last Vitals:  Vitals:   07/26/21 1133 07/26/21 1155  BP:  139/67  Pulse: 74 68  Resp: 18 14  Temp:  36.4 C  SpO2: 98% 100%    Last Pain:  Vitals:   07/26/21 1155  TempSrc:   PainSc: 1                  Corinda Gubler

## 2021-07-26 NOTE — Interval H&P Note (Signed)
History and Physical Interval Note:  07/26/2021 9:38 AM  Kara Carter  has presented today for surgery, with the diagnosis of Abnormal uterine bleeding Acute blood loss anemia.  The various methods of treatment have been discussed with the patient and family. After consideration of risks, benefits and other options for treatment, the patient has consented to  Procedure(s): DILATATION & CURETTAGE/HYSTEROSCOPY WITH NOVASURE ABLATION (N/A) as a surgical intervention.  The patient's history has been reviewed, patient examined, no change in status, stable for surgery.  I have reviewed the patient's chart and labs.  Questions were answered to the patient's satisfaction.     Vena Austria

## 2021-07-26 NOTE — Anesthesia Procedure Notes (Signed)
Procedure Name: LMA Insertion Date/Time: 07/26/2021 10:12 AM Performed by: Omer Jack, CRNA Pre-anesthesia Checklist: Patient identified, Patient being monitored, Timeout performed, Emergency Drugs available and Suction available Patient Re-evaluated:Patient Re-evaluated prior to induction Oxygen Delivery Method: Circle system utilized Preoxygenation: Pre-oxygenation with 100% oxygen Induction Type: IV induction Ventilation: Mask ventilation without difficulty LMA: LMA inserted LMA Size: 4.0 Tube type: Oral Number of attempts: 1 Placement Confirmation: positive ETCO2 and breath sounds checked- equal and bilateral Tube secured with: Tape Dental Injury: Teeth and Oropharynx as per pre-operative assessment

## 2021-07-27 ENCOUNTER — Encounter: Payer: Self-pay | Admitting: Obstetrics and Gynecology

## 2021-07-27 LAB — SURGICAL PATHOLOGY

## 2021-07-30 NOTE — Progress Notes (Signed)
Obstetrics & Gynecology Surgery H&P    Chief Complaint: Scheduled Surgery   History of Present Illness: Patient is a 45 y.o. G1P0 presenting for scheduled hysteroscopy, D&C, novasure, for the treatment or further evaluation of AUB.   Prior Treatments prior to proceeding with surgery include: medical management discussion of IUD  Preoperative Pap: 04/16/21 NILM HPV negative Preoperative Endometrial biopsy: Collected today Preoperative Ultrasound: 05/25/21 normal uterus no uterine fibroids.  EMS 26mm no focal abnormaliteis   Review of Systems:10 point review of systems  Past Medical History:  Patient Active Problem List   Diagnosis Date Noted   Abnormal uterine bleeding (AUB)     Past Surgical History:  Past Surgical History:  Procedure Laterality Date   DILITATION & CURRETTAGE/HYSTROSCOPY WITH NOVASURE ABLATION N/A 07/26/2021   Procedure: DILATATION & CURETTAGE/HYSTEROSCOPY WITH NOVASURE ABLATION;  Surgeon: Vena Austria, MD;  Location: [facility] ORS;  Service: Gynecology;  Laterality: N/A;   TONSILLECTOMY     as a child    Family History:  Family History  Problem Relation Age of Onset   Hypertension Mother    Hypertension Father     Social History:  Social History   Socioeconomic History   Marital status: Married    Spouse name: tyrone   Number of children: Not on file   Years of education: Not on file   Highest education level: Not on file  Occupational History   Occupation: Midwife  Tobacco Use   Smoking status: Never   Smokeless tobacco: Never  Vaping Use   Vaping Use: Never used  Substance and Sexual Activity   Alcohol use: Not Currently   Drug use: Never   Sexual activity: Yes    Partners: Male    Birth control/protection: None  Other Topics Concern   Not on file  Social History Narrative   Patient lives in her home with her husband.   She feels safe in her home.   Social Determinants of Health   Financial Resource Strain: Not on file  Food  Insecurity: Not on file  Transportation Needs: Not on file  Physical Activity: Not on file  Stress: Not on file  Social Connections: Not on file  Intimate Partner Violence: Not on file    Allergies:  Allergies  Allergen Reactions   Guaifenesin Hives    Medications: Prior to Admission medications   Medication Sig Start Date End Date Taking? Authorizing Provider  HYDROcodone-acetaminophen (NORCO/VICODIN) 5-325 MG tablet Take 1 tablet by mouth every 6 (six) hours as needed. 07/26/21   Vena Austria, MD  ibuprofen (ADVIL) 600 MG tablet Take 1 tablet (600 mg total) by mouth every 6 (six) hours as needed. 07/26/21   Vena Austria, MD    Physical Exam Vitals: Blood pressure 120/76, height 5\' 1"  (1.549 m), weight 184 lb (83.5 kg), last menstrual period 06/25/2021. General: NAD HEENT: normocephalic, anicteric Pulmonary: No increased work of breathing Cardiovascular: RRR, distal pulses 2+ Abdomen: soft, non-tender Genitourinary:  External: Normal external female genitalia.  Normal urethral meatus, normal Bartholin's and Skene's glands.    Vagina: Normal vaginal mucosa, no evidence of prolapse.    Cervix: Grossly normal in appearance, no bleeding  Uterus: Non-enlarged, mobile, normal contour.  No CMT  Adnexa: ovaries non-enlarged, no adnexal masses  Rectal: deferred  Lymphatic: no evidence of inguinal lymphadenopathy  Extremities: no edema, erythema, or tenderness Neurologic: Grossly intact Psychiatric: mood appropriate, affect full  ENDOMETRIAL BIOPSY     The indications for endometrial biopsy were reviewed.   Risks  of the biopsy including cramping, bleeding, infection, uterine perforation, inadequate specimen and need for additional procedures  were discussed. The patient states she understands and agrees to undergo procedure today. Consent was signed. Time out was performed. Urine HCG was negative. A Graves speculum was placed and the cervix was brought into view.  The cervix  was prepped with Betadine. A single-toothed tenaculum was  placed on the anterior lip of the cervix for traction. A 3 mm pipelle was introduced through the cervix into the endometrial cavity without difficulty to a depth of 9cm, and a small amount of tissue was obtained, the resulting specime sent to pathology. The instruments were removed from the patient's vagina. Minimal bleeding from the cervix was noted. The patient tolerated the procedure well. Routine post-procedure instructions were given to the patient.  She will be contacted by phone one results become available.     Imaging No results found.  Assessment: 45 y.o. G1P0 presenting for scheduled hysteroscopy, D&C, novasure ablation  Plan: 1) The patient was counseled on the overall effectiveness of endometrial ablation in achieving amenorrhea.  She is aware that some patient may continue to have menstrual cycles although these are generally greatly reduced in flow.  In addition she was quoted a failure rate for endometrial ablation of approximately 25% within the frist 4 years, but these failures may happen at any time during or after the initial 4 year postop period.  She is aware that pregnancy is contra-indicated in the setting of prior endometrial ablation, and that ablation itself does not confer any contraceptive benefit.  She will therefore need to continue to rely on some means of contraception following the procedure.  Although rare and generally confined to patient who have undergone prior tubal ligatoin, post-ablation tubal sterilizaton syndrome (PATSS) may also occur with no reliable incidence rates as the majority of published literature is limited to case reports.   Prior to being considered a candidate for Novasure ablation she will need to undergo endometrial biopsy to rule out endometrial hyperplasia or malignancy as the cause of her bleeding, have an up to date pap on record, and undergo transvaginal ultrasound to verify the absence  of focal endometrial lesion which may need to be addressed prior to proceeding with ablation.  In addition she is aware that the device is limited for use in women with a normal uterine cavity and uterine leiomyomata <3cm in size.  If present leiomyomata may increase the long-term failure rate of the procedure.  In rare instances the presence of a uterine septum or arcuate uterus, which may not be readily apparent on preoperative ultrasound, may necessitate the procedure to be aborted.     2) Routine postoperative instructions were reviewed with the patient and her family in detail today including the expected length of recovery and likely postoperative course.  The patient concurred with the proposed plan, giving informed written consent for the surgery today.  Patient instructed on the importance of being NPO after midnight prior to her procedure.  If warranted preoperative prophylactic antibiotics and SCDs ordered on call to the OR to meet SCIP guidelines and adhere to recommendation laid forth in ACOG Practice Bulletin Number 104 May 2009  "Antibiotic Prophylaxis for Gynecologic Procedures".     Vena Austria, MD, [facility], [facility]

## 2021-08-06 ENCOUNTER — Encounter: Payer: Self-pay | Admitting: Obstetrics and Gynecology

## 2021-08-06 ENCOUNTER — Other Ambulatory Visit: Payer: Self-pay

## 2021-08-06 ENCOUNTER — Ambulatory Visit (INDEPENDENT_AMBULATORY_CARE_PROVIDER_SITE_OTHER): Payer: BC Managed Care – PPO | Admitting: Obstetrics and Gynecology

## 2021-08-06 VITALS — BP 132/82 | Wt 180.0 lb

## 2021-08-06 DIAGNOSIS — Z4889 Encounter for other specified surgical aftercare: Secondary | ICD-10-CM

## 2021-08-06 MED ORDER — PAROXETINE MESYLATE 7.5 MG PO CAPS: 1.0000 | ORAL_CAPSULE | Freq: Every day | ORAL | 11 refills | Status: AC

## 2021-08-06 NOTE — Progress Notes (Signed)
      Postoperative Follow-up Patient presents post op from hysteroscopy, novasure 1weeks ago for abnormal uterine bleeding.  Subjective: Patient reports marked improvement in her preop symptoms. Eating a regular diet without difficulty. Pain is controlled without any medications.  Activity: normal activities of daily living.  Objective: Blood pressure 132/82, weight 180 lb (81.6 kg).  General: NAD Pulmonary: no increased work of breathing Extremities: no edema Neurologic: normal gait   Admission on 07/26/2021, Discharged on 07/26/2021  Component Date Value Ref Range Status   ABO/RH(D) 07/26/2021    Final                   Value:O POS Performed at [facility], [address] [place], [place] [address]    Preg Test, Ur 07/26/2021 NEGATIVE  NEGATIVE Final   Comment:        THE SENSITIVITY OF THIS METHODOLOGY IS >24 mIU/mL    SURGICAL PATHOLOGY 07/26/2021    Final-Edited                   Value:SURGICAL PATHOLOGY CASE: [id number] PATIENT: Vickie Epley Surgical Pathology Report     Specimen Submitted: A. Endometrial curettings  Clinical History: Abnormal uterine bleeding, acute blood loss anemia    DIAGNOSIS: A. ENDOMETRIUM; CURETTAGE: - SECRETORY PHASE ENDOMETRIUM. - SUPERFICIAL PORTIONS OF BENIGN ENDO AND ECTOCERVICAL TISSUE. - NEGATIVE FOR ATYPICAL HYPERPLASIA/EIN, DYSPLASIA, AND MALIGNANCY.  GROSS DESCRIPTION: A. Labeled: Endometrial curettings Received: Formalin Collection time: 10:38 AM on 07/26/2021 Placed into formalin time: 10:58 AM on 07/26/2021 Tissue fragment(s): Multiple Size: Aggregate, 2.4 x 2.4 x 0.4 cm Description: Received on Telfa pad are fragments of tan-pink soft tissue and blood clot. Entirely submitted in 1 cassette.  RB 07/26/2021  Final Diagnosis performed by Katherine Mantle, MD.   Electronically signed 07/27/2021 8:04:54AM The electronic signature indicates that the named Attending Pathologist has evaluated the specimen                           Technical component performed at [facility], [address] [place], [place] [address] Lab: [phone] Dir: Jolene Schimke, MD, MMM  Professional component performed at [facility], [facility], [address] [place], [place], [place] [address] Lab: [phone] Dir: Georgiann Cocker. Oneita Kras, MD     Assessment: 45 y.o. s/p hysteroscopy, novasure stable  Plan: Patient has done well after surgery with no apparent complications.  I have discussed the post-operative course to date, and the expected progress moving forward.  The patient understands what complications to be concerned about.  I will see the patient in routine follow up, or sooner if needed.    Activity plan: No restriction.  Brisdelle Rx for vasomotor symptoms  Vena Austria, MD, [facility], [facility] 08/06/2021, 4:49 PM

## 2022-10-02 DIAGNOSIS — L723 Sebaceous cyst: Secondary | ICD-10-CM | POA: Diagnosis not present

## 2022-10-23 ENCOUNTER — Encounter: Payer: Self-pay | Admitting: Otolaryngology

## 2022-10-23 ENCOUNTER — Other Ambulatory Visit: Payer: Self-pay

## 2022-10-30 ENCOUNTER — Ambulatory Visit
Admission: RE | Admit: 2022-10-30 | Discharge: 2022-10-30 | Disposition: A | Discharge status: Home / Self Care | Payer: BC Managed Care – PPO | Source: Ambulatory Visit | Attending: Otolaryngology | Admitting: Otolaryngology

## 2022-10-30 ENCOUNTER — Other Ambulatory Visit: Payer: Self-pay

## 2022-10-30 ENCOUNTER — Ambulatory Visit: Payer: BC Managed Care – PPO | Admitting: Anesthesiology

## 2022-10-30 ENCOUNTER — Encounter
Admission: RE | Disposition: A | Discharge status: Home / Self Care | Payer: Self-pay | Source: Ambulatory Visit | Attending: Otolaryngology

## 2022-10-30 ENCOUNTER — Encounter: Payer: Self-pay | Admitting: Otolaryngology

## 2022-10-30 DIAGNOSIS — L723 Sebaceous cyst: Secondary | ICD-10-CM | POA: Diagnosis not present

## 2022-10-30 DIAGNOSIS — L720 Epidermal cyst: Secondary | ICD-10-CM | POA: Diagnosis not present

## 2022-10-30 HISTORY — PX: MASS EXCISION: SHX2000

## 2022-10-30 SURGERY — EXCISION MASS
Anesthesia: General | Site: Face | Laterality: Left

## 2022-10-30 MED ORDER — DEXAMETHASONE SODIUM PHOSPHATE 4 MG/ML IJ SOLN
INTRAMUSCULAR | Status: DC | PRN
  Administered 2022-10-30: 4 mg via INTRAVENOUS

## 2022-10-30 MED ORDER — OXYCODONE HCL 5 MG/5ML PO SOLN: 5.0000 mg | Freq: Once | ORAL | Status: DC | PRN

## 2022-10-30 MED ORDER — ONDANSETRON HCL 4 MG/2ML IJ SOLN
INTRAMUSCULAR | Status: DC | PRN
  Administered 2022-10-30: 4 mg via INTRAVENOUS

## 2022-10-30 MED ORDER — ONDANSETRON HCL 4 MG PO TABS: 4.0000 mg | ORAL_TABLET | Freq: Three times a day (TID) | ORAL | 0 refills | Status: AC | PRN

## 2022-10-30 MED ORDER — MIDAZOLAM HCL 5 MG/5ML IJ SOLN
INTRAMUSCULAR | Status: DC | PRN
  Administered 2022-10-30: 2 mg via INTRAVENOUS

## 2022-10-30 MED ORDER — ONDANSETRON HCL 4 MG/2ML IJ SOLN: 4.0000 mg | Freq: Once | INTRAMUSCULAR | Status: DC | PRN

## 2022-10-30 MED ORDER — FENTANYL CITRATE PF 50 MCG/ML IJ SOSY: 25.0000 ug | PREFILLED_SYRINGE | INTRAMUSCULAR | Status: DC | PRN

## 2022-10-30 MED ORDER — LIDOCAINE HCL (CARDIAC) PF 100 MG/5ML IV SOSY
PREFILLED_SYRINGE | INTRAVENOUS | Status: DC | PRN
  Administered 2022-10-30: 40 mg via INTRATRACHEAL

## 2022-10-30 MED ORDER — PROPOFOL 10 MG/ML IV BOLUS
INTRAVENOUS | Status: DC | PRN
  Administered 2022-10-30: 200 mg via INTRAVENOUS

## 2022-10-30 MED ORDER — LACTATED RINGERS IV SOLN: INTRAVENOUS | Status: DC

## 2022-10-30 MED ORDER — LIDOCAINE-EPINEPHRINE 1 %-1:100000 IJ SOLN
INTRAMUSCULAR | Status: DC | PRN
  Administered 2022-10-30: 2 mL

## 2022-10-30 MED ORDER — GLYCOPYRROLATE 0.2 MG/ML IJ SOLN
INTRAMUSCULAR | Status: DC | PRN
  Administered 2022-10-30: .2 mg via INTRAVENOUS

## 2022-10-30 MED ORDER — FENTANYL CITRATE (PF) 100 MCG/2ML IJ SOLN
INTRAMUSCULAR | Status: DC | PRN
  Administered 2022-10-30 (×2): 50 ug via INTRAVENOUS

## 2022-10-30 MED ORDER — BACITRACIN 500 UNIT/GM EX OINT
TOPICAL_OINTMENT | CUTANEOUS | Status: DC | PRN
  Administered 2022-10-30: 1 via TOPICAL

## 2022-10-30 MED ORDER — OXYCODONE HCL 5 MG PO TABS: 5.0000 mg | ORAL_TABLET | Freq: Once | ORAL | Status: DC | PRN

## 2022-10-30 SURGICAL SUPPLY — 25 items
APPLICATOR COTTON TIP 3IN (MISCELLANEOUS) ×1 IMPLANT
CORD BIP STRL DISP 12FT (MISCELLANEOUS) IMPLANT
DISP BIPOLAR PROBE CORTI STIMU (MISCELLANEOUS) ×1
ELECT REM PT RETURN 9FT ADLT (ELECTROSURGICAL) ×1
ELECTRODE REM PT RTRN 9FT ADLT (ELECTROSURGICAL) ×1 IMPLANT
GAUZE SPONGE 4X4 12PLY STRL (GAUZE/BANDAGES/DRESSINGS) IMPLANT
GLOVE SURG GAMMEX PI TX LF 7.5 (GLOVE) ×2 IMPLANT
GOWN STRL REUS W/ TWL LRG LVL3 (GOWN DISPOSABLE) ×1 IMPLANT
GOWN STRL REUS W/TWL LRG LVL3 (GOWN DISPOSABLE) ×1
KIT TURNOVER KIT A (KITS) ×1 IMPLANT
NDL HYPO 25GX1X1/2 BEV (NEEDLE) ×1 IMPLANT
NEEDLE HYPO 25GX1X1/2 BEV (NEEDLE) ×1 IMPLANT
NS IRRIG 500ML POUR BTL (IV SOLUTION) ×1 IMPLANT
PACK ENT CUSTOM (PACKS) ×1 IMPLANT
PROBE BIPOLAR CORTI STIM NCS (MISCELLANEOUS) IMPLANT
SOL PREP PVP 2OZ (MISCELLANEOUS) ×1
SOLUTION PREP PVP 2OZ (MISCELLANEOUS) ×1 IMPLANT
SPONGE KITTNER 5P (MISCELLANEOUS) ×1 IMPLANT
STRAP BODY AND KNEE 60X3 (MISCELLANEOUS) ×1 IMPLANT
SUCTION FRAZIER HANDLE 10FR (MISCELLANEOUS) ×1
SUCTION TUBE FRAZIER 10FR DISP (MISCELLANEOUS) IMPLANT
SUT PROLENE 6 0 P 1 18 (SUTURE) IMPLANT
SUT VICRYL 5-0 (SUTURE) IMPLANT
SYR 10ML LL (SYRINGE) ×1 IMPLANT
SYR EAR/ULCER 2OZ (SYRINGE) ×1 IMPLANT

## 2022-10-30 NOTE — Transfer of Care (Signed)
Immediate Anesthesia Transfer of Care Note  Patient: Kara Carter  Procedure(s) Performed: EXCISION FACIAL MASS WITH FACIAL NERVE MONITORING (Left: Face)  Patient Location: PACU  Anesthesia Type: General  Level of Consciousness: awake, alert  and patient cooperative  Airway and Oxygen Therapy: Patient Spontanous Breathing and Patient connected to supplemental oxygen  Post-op Assessment: Post-op Vital signs reviewed, Patient's Cardiovascular Status Stable, Respiratory Function Stable, Patent Airway and No signs of Nausea or vomiting  Post-op Vital Signs: Reviewed and stable  Complications: No notable events documented.

## 2022-10-30 NOTE — Op Note (Signed)
..  10/30/2022  10:10 AM    Kara Carter  [id number]   Pre-Op Dx:  Epidermal inclusion cyst  Post-op Dx: Epidermal inclusion cyst  Proc:  Excision of left facial mass 2cm with intermediate closure.  Surg: Roney Mans [name]  Anes:  General by laryngeal mask  EBL:  None  Comp:  None  Findings:  Cystic mass with debris.  Scar tissue adherent to overlying skin with central pore.  Cyst completely excised.  Nerve stimulator used throughtout case and no stimulation of marginal mandibular nerve seen.  Procedure: With the patient in a comfortable supine position, general laryngeal mask anesthesia was administered.  At an appropriate level, the patient's left face as prepped and draped in a sterile fashion.  42ml of 1% lidocaine with 1:100,000 epinephrine was injected.  A previously marked skin crease along a RSTL was used that was in continuity to the central pore of the cystic mass.  Using a 15 blade scalpel, an elipitical incision was made of the overlying skin and then central pore.  Careful dissection was made surrounding the mass.  The cyst was ruptured and contents removed.  The cystic lining was carefully dissected away from the underlying tissue.  Nerve stimulator was used throughout without any movement of the patient's marginal mandibular nerve.  The cyst was completely removed.  The wound was irrigated and then closed in a multilayer fashion with vicryl deep and prolene running for cutaneous closure..    Following this  The patient was returned to anesthesia, awakened, and transferred to recovery in stable condition.  Dispo:  PACU to home  Plan: Bacitracin ointment.  Follow up Tuesday for suture removal.   [name] 10:10 AM 10/30/2022

## 2022-10-30 NOTE — H&P (Signed)
..  History and Physical paper copy reviewed and updated date of procedure and will be scanned into system.  Patient seen and examined.  

## 2022-10-30 NOTE — Anesthesia Postprocedure Evaluation (Signed)
Anesthesia Post Note  Patient: Kara Carter  Procedure(s) Performed: EXCISION FACIAL MASS WITH FACIAL NERVE MONITORING (Left: Face)  Patient location during evaluation: PACU Anesthesia Type: General Level of consciousness: awake and alert Pain management: pain level controlled Vital Signs Assessment: post-procedure vital signs reviewed and stable Respiratory status: spontaneous breathing, nonlabored ventilation, respiratory function stable and patient connected to nasal cannula oxygen Cardiovascular status: blood pressure returned to baseline and stable Postop Assessment: no apparent nausea or vomiting Anesthetic complications: no   No notable events documented.   Last Vitals:  Vitals:   10/30/22 1030 10/30/22 1037  BP: 122/75 114/65  Pulse: 80 78  Resp: 17 (!) 21  Temp:  (!) 36.4 C  SpO2: 97% 97%    Last Pain:  Vitals:   10/30/22 1037  TempSrc:   PainSc: 0-No pain                 Lenard Simmer

## 2022-10-30 NOTE — Anesthesia Preprocedure Evaluation (Signed)
Anesthesia Evaluation  Patient identified by MRN, date of birth, ID band Patient awake    Reviewed: Allergy & Precautions, NPO status , Patient's Chart, lab work & pertinent test results  History of Anesthesia Complications Negative for: history of anesthetic complications  Airway Mallampati: III  TM Distance: >3 FB Neck ROM: Full    Dental no notable dental hx. (+) Teeth Intact, Dental Advidsory Given   Pulmonary neg pulmonary ROS, neg shortness of breath, neg sleep apnea, neg COPD, neg recent URI, Patient abstained from smoking.Not current smoker   Pulmonary exam normal breath sounds clear to auscultation       Cardiovascular Exercise Tolerance: Good METS(-) hypertension(-) angina (-) CAD and (-) Past MI negative cardio ROS (-) dysrhythmias  Rhythm:Regular Rate:Normal - Systolic murmurs    Neuro/Psych negative neurological ROS  negative psych ROS   GI/Hepatic ,neg GERD  ,,(+)     (-) substance abuse    Endo/Other  neg diabetes    Renal/GU negative Renal ROS     Musculoskeletal   Abdominal   Peds  Hematology  (+) Blood dyscrasia, anemia   Anesthesia Other Findings Past Medical History: No date: Abnormal uterine bleeding No date: Anemia 06/2021: History of kidney stones  Reproductive/Obstetrics                             Anesthesia Physical Anesthesia Plan  ASA: 2  Anesthesia Plan: General   Post-op Pain Management:    Induction: Intravenous  PONV Risk Score and Plan: 4 or greater and Ondansetron, Dexamethasone and Midazolam  Airway Management Planned: LMA  Additional Equipment: None  Intra-op Plan:   Post-operative Plan: Extubation in OR  Informed Consent: I have reviewed the patients History and Physical, chart, labs and discussed the procedure including the risks, benefits and alternatives for the proposed anesthesia with the patient or authorized representative who  has indicated his/her understanding and acceptance.     Dental advisory given  Plan Discussed with: CRNA and Surgeon  Anesthesia Plan Comments: (Discussed risks of anesthesia with patient, including PONV, sore throat, lip/dental damage. Rare risks discussed as well, such as cardiorespiratory and neurological sequelae, and allergic reactions. Patient understands.)        Anesthesia Quick Evaluation

## 2022-10-30 NOTE — Anesthesia Procedure Notes (Addendum)
Procedure Name: LMA Insertion Date/Time: 10/30/2022 9:38 AM  Performed by: Andee Poles, CRNAPre-anesthesia Checklist: Patient identified, Emergency Drugs available, Suction available, Timeout performed and Patient being monitored Patient Re-evaluated:Patient Re-evaluated prior to induction Oxygen Delivery Method: Circle system utilized Preoxygenation: Pre-oxygenation with 100% oxygen Induction Type: IV induction LMA: LMA inserted LMA Size: 4.0 Number of attempts: 1 Placement Confirmation: positive ETCO2 and breath sounds checked- equal and bilateral Tube secured with: Tape

## 2022-11-01 LAB — SURGICAL PATHOLOGY

## 2023-05-09 IMAGING — CT CT RENAL STONE PROTOCOL
2 of 4 series · 16 of 46 positions shown, 18 images · non-contrast
Comparison: None.

CLINICAL DATA: Flank pain

EXAM:
CT ABDOMEN AND PELVIS WITHOUT CONTRAST
TECHNIQUE: Multidetector CT imaging of the abdomen and pelvis was performed
following the standard protocol without IV contrast.

[Series 2: stone full standard · axial · 0.76mm/px · z∈[-486,-71]mm · 13 of 91 slices shown, 15 images]
[im 4/91  soft-tissue]
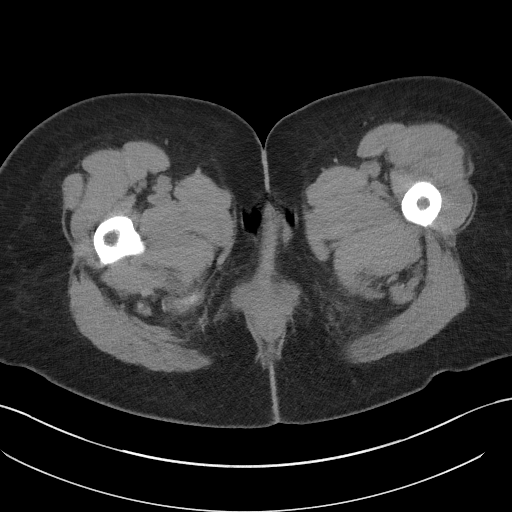
[im 4/91  bone]
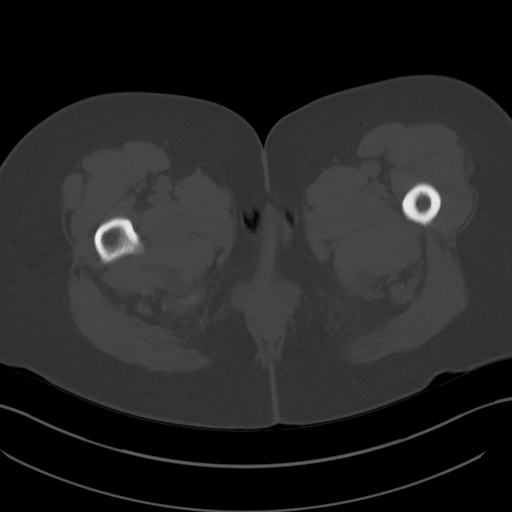
[im 11/91  soft-tissue]
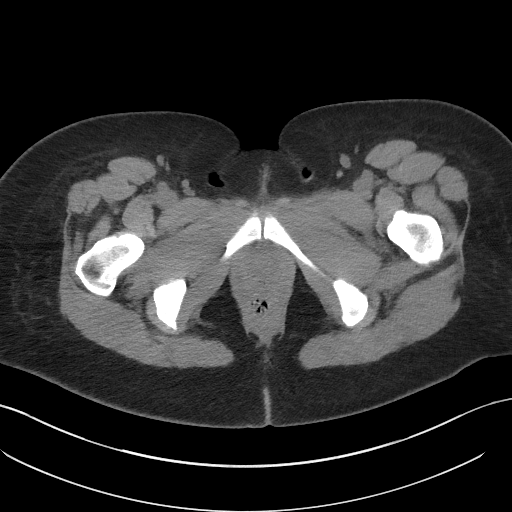
[im 18/91  soft-tissue]
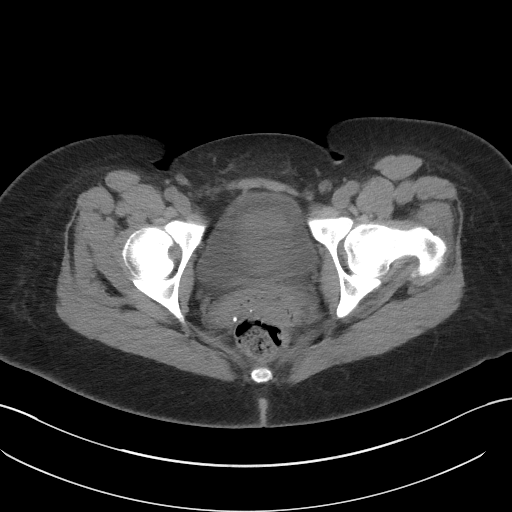
[im 25/91  soft-tissue]
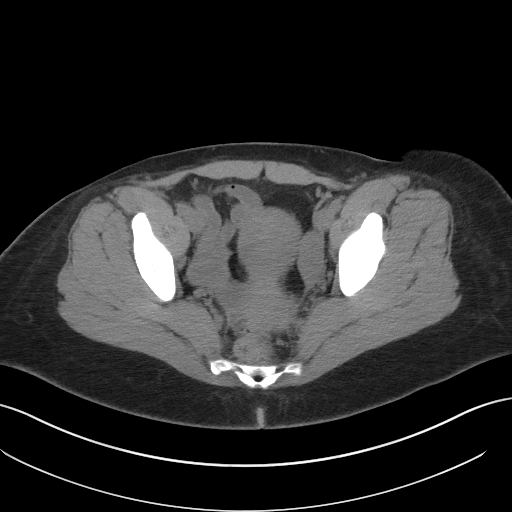
[im 32/91  soft-tissue]
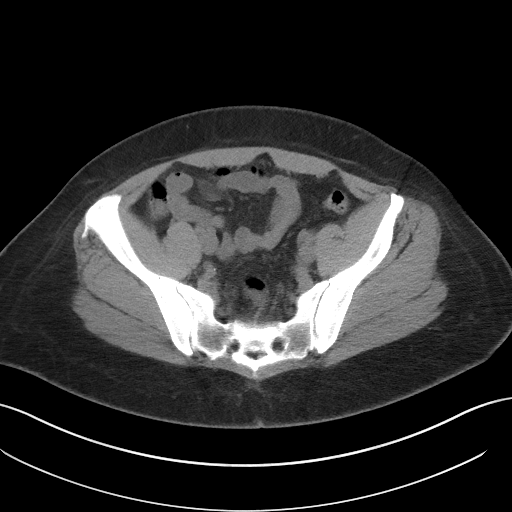
[im 39/91  soft-tissue]
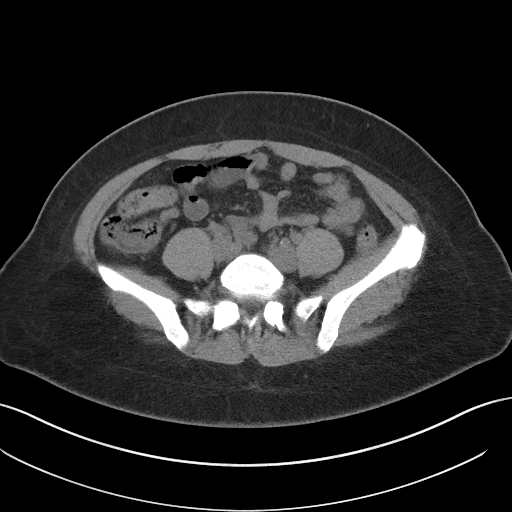
[im 46/91  soft-tissue]
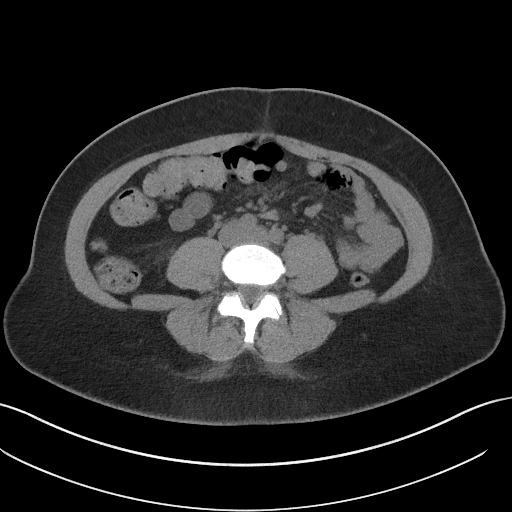
[im 52/91  soft-tissue]
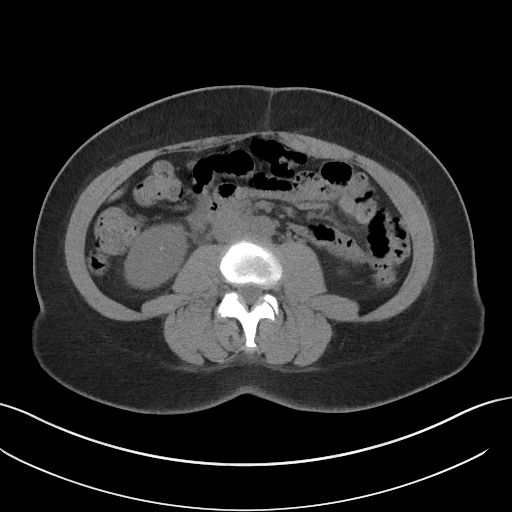
[im 59/91  soft-tissue]
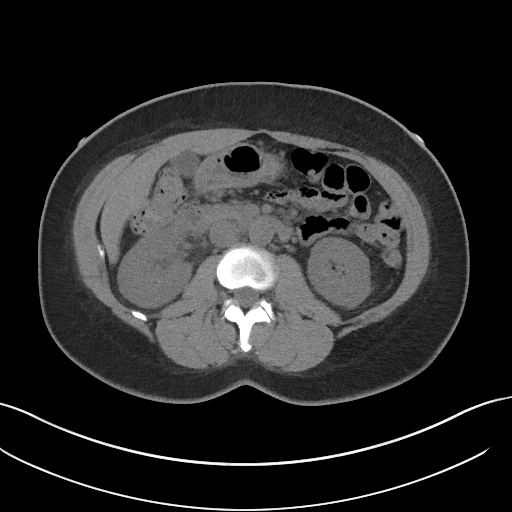
[im 59/91  bone]
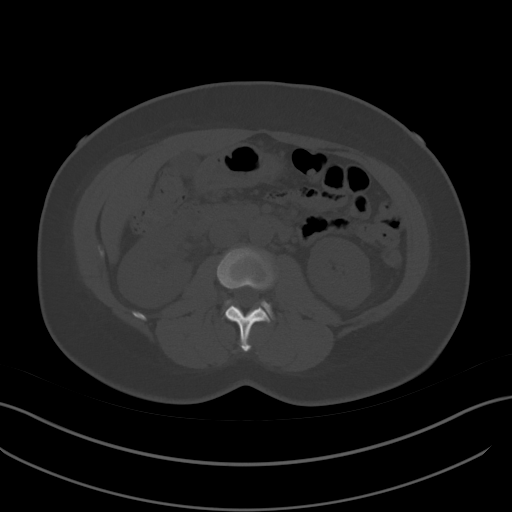
[im 66/91  soft-tissue]
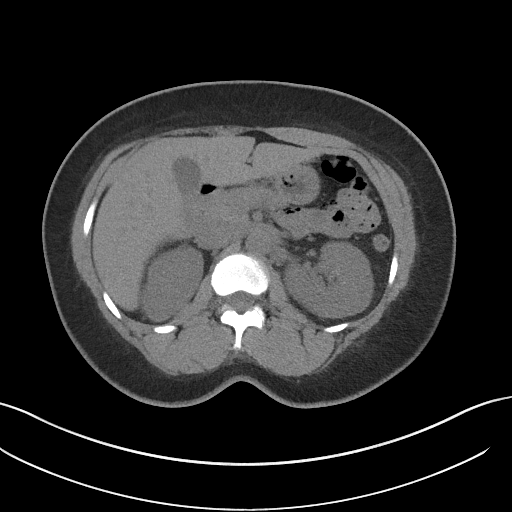
[im 73/91  soft-tissue]
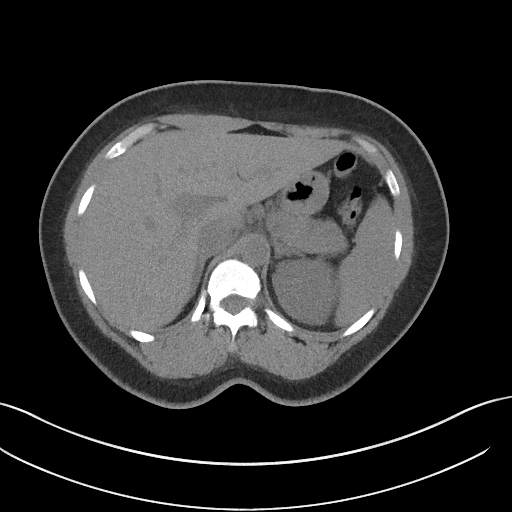
[im 80/91  soft-tissue]
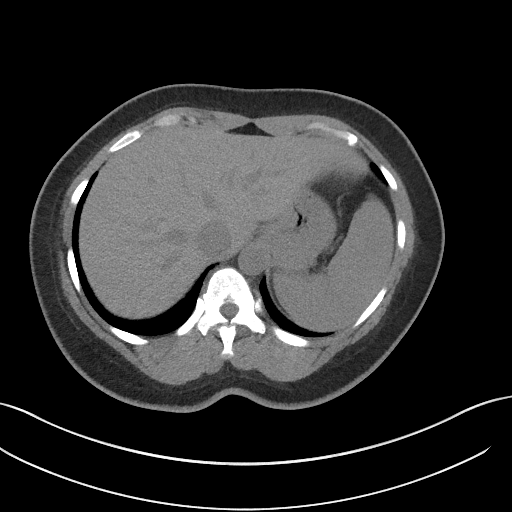
[im 87/91  soft-tissue]
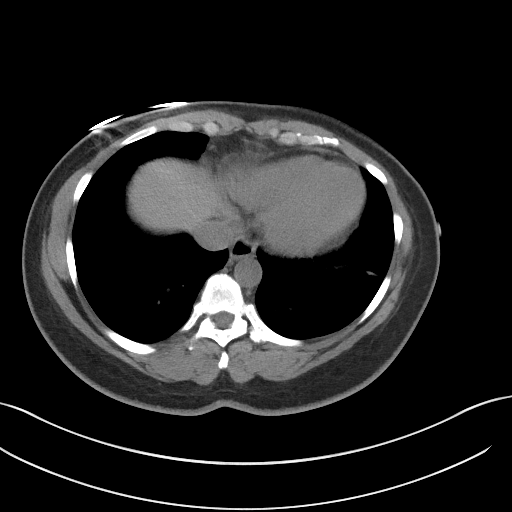

[Series 5: coronal · coronal · 0.78mm/px · 3 of 138 slices shown]
[im 46/138  soft-tissue]
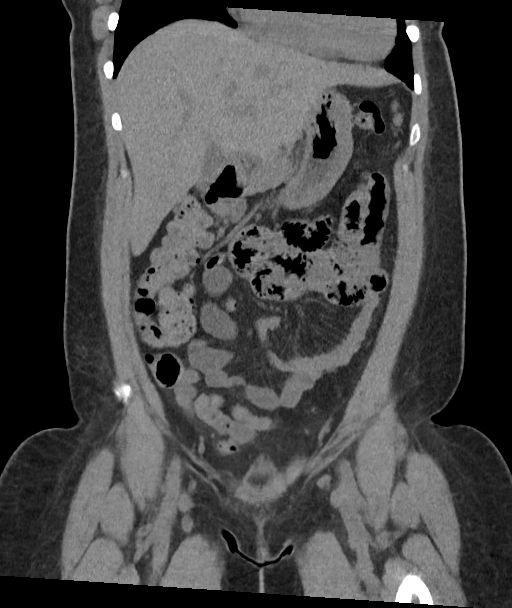
[im 61/138  soft-tissue]
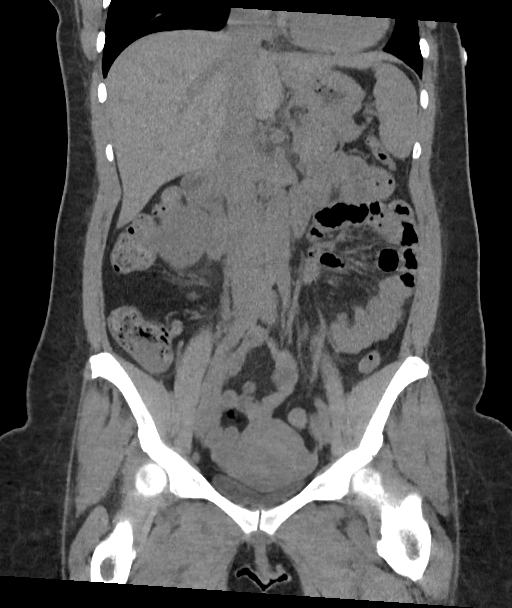
[im 77/138  soft-tissue]
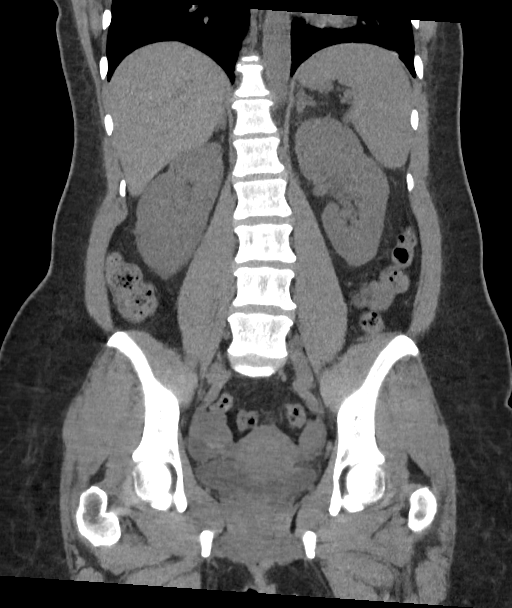

[16 of 46 positions shown; findings below may reference images not displayed]

FINDINGS: Lower chest: Lung bases demonstrate atelectasis or scarring at the
left base. Borderline cardiomegaly.

Hepatobiliary: No focal liver abnormality is seen. No gallstones,
gallbladder wall thickening, or biliary dilatation.

Pancreas: Unremarkable. No pancreatic ductal dilatation or
surrounding inflammatory changes.

Spleen: Normal in size without focal abnormality.

Adrenals/Urinary Tract: Adrenal glands are normal. There are small
intrarenal stones bilaterally. No hydronephrosis or ureteral stone.
Right greater than left perinephric fat stranding. The urinary
bladder is normal

Stomach/Bowel: Stomach is within normal limits. Appendix appears
normal. No evidence of bowel wall thickening, distention, or
inflammatory changes.

Vascular/Lymphatic: No significant vascular findings are present. No
enlarged abdominal or pelvic lymph nodes.

Reproductive: Uterus and bilateral adnexa are unremarkable.

Other: No free air.  Small free fluid in the pelvis

Musculoskeletal: No acute or significant osseous findings.
IMPRESSION: 1. Small intrarenal stones without hydronephrosis or ureteral stone.
2. Right greater than left perinephric fat stranding which is
nonspecific though consider pyelonephritis in the appropriate
clinical setting.
3. Small free fluid in the pelvis
